# Patient Record
Sex: Male | Born: 1995 | Race: Black or African American | Hispanic: No | Marital: Single | Smoking: Current every day smoker
Health system: Southern US, Community
[De-identification: ages and names within clinical notes are randomized; demographics above are authoritative.]

## PROBLEM LIST (undated history)

## (undated) ENCOUNTER — Emergency Department (HOSPITAL_BASED_OUTPATIENT_CLINIC_OR_DEPARTMENT_OTHER): Admission: EM | Payer: Medicaid Other | Source: Home / Self Care

## (undated) ENCOUNTER — Emergency Department (HOSPITAL_COMMUNITY): Admission: EM | Payer: Medicaid Other | Source: Home / Self Care

## (undated) DIAGNOSIS — J45909 Unspecified asthma, uncomplicated: Secondary | ICD-10-CM

## (undated) HISTORY — PX: HERNIA REPAIR: SHX51

---
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
## 2018-01-22 ENCOUNTER — Encounter (HOSPITAL_COMMUNITY): Payer: Self-pay | Admitting: Emergency Medicine

## 2018-01-22 ENCOUNTER — Ambulatory Visit (HOSPITAL_COMMUNITY)
Admission: EM | Admit: 2018-01-22 | Discharge: 2018-01-22 | Disposition: A | Discharge status: Home / Self Care | Payer: Medicaid Other | Attending: Family Medicine | Admitting: Family Medicine

## 2018-01-22 DIAGNOSIS — L509 Urticaria, unspecified: Secondary | ICD-10-CM | POA: Diagnosis not present

## 2018-01-22 MED ORDER — PREDNISONE 10 MG (48) PO TBPK: ORAL_TABLET | ORAL | 0 refills | Status: DC

## 2018-01-22 NOTE — ED Triage Notes (Signed)
Pt c/o rash on bilateral arms. Denies any new creams/lotions.

## 2018-01-26 NOTE — ED Provider Notes (Signed)
[facility]   [id number] 01/22/18 Arrival Time: 1807  ASSESSMENT & PLAN:  1. Hives     Meds ordered this encounter  Medications  . predniSONE (STERAPRED UNI-PAK 48 TAB) 10 MG (48) TBPK tablet    Sig: Take as directed.    Dispense:  48 tablet    Refill:  0   OTC Benadryl if needed. Will follow up with PCP or here if worsening or failing to improve as anticipated. Reviewed expectations re: course of current medical issues. Questions answered. Outlined signs and symptoms indicating need for more acute intervention. Patient verbalized understanding. After Visit Summary given.   SUBJECTIVE:  Joshua Duncan is a 22 y.o. male who presents with a skin complaint.   Location: "all over" Onset: abrupt Duration: 10 day Pruritic? Yes Painful? No Progression: fluctuating a lot  Drainage? No  Known trigger? No  New soaps/lotions/topicals/detergents? No Environmental exposures or allergies? none Contacts with similar? No Recent travel? No  Other associated symptoms: none Therapies tried thus far: Benadryl without much help Denies fever. No specific aggravating or alleviating factors reported.  ROS: As per HPI.  OBJECTIVE: Vitals:   01/22/18 1816  BP: (!) 148/61  Pulse: 62  Resp: 18  Temp: 99.1 F (37.3 C)  SpO2: 100%    General appearance: alert; no distress Lungs: clear to auscultation bilaterally Heart: regular rate and rhythm Extremities: no edema Skin: warm and dry; smooth, slightly elevated and erythematous plaques of variable size over his extremities mainly Psychological: alert and cooperative; normal mood and affect  No Known Allergies  Past Medical History:  Diagnosis Date  . Asthma    Social History   Socioeconomic History  . Marital status: Single    Spouse name: Not on file  . Number of children: Not on file  . Years of education: Not on file  . Highest education level: Not on file  Occupational History  . Not on file  Social Needs    . Financial resource strain: Not on file  . Food insecurity:    Worry: Not on file    Inability: Not on file  . Transportation needs:    Medical: Not on file    Non-medical: Not on file  Tobacco Use  . Smoking status: Current Every Day Smoker  . Smokeless tobacco: Never Used  Substance and Sexual Activity  . Alcohol use: Yes  . Drug use: Yes    Types: Marijuana  . Sexual activity: Not on file  Lifestyle  . Physical activity:    Days per week: Not on file    Minutes per session: Not on file  . Stress: Not on file  Relationships  . Social connections:    Talks on phone: Not on file    Gets together: Not on file    Attends religious service: Not on file    Active member of club or organization: Not on file    Attends meetings of clubs or organizations: Not on file    Relationship status: Not on file  . Intimate partner violence:    Fear of current or ex partner: Not on file    Emotionally abused: Not on file    Physically abused: Not on file    Forced sexual activity: Not on file  Other Topics Concern  . Not on file  Social History Narrative  . Not on file   No family history on file. Past Surgical History:  Procedure Laterality Date  . HERNIA REPAIR  Mardella LaymanHagler, Chayse Zatarain, MD 01/26/18 [phone]

## 2018-08-30 ENCOUNTER — Ambulatory Visit (HOSPITAL_COMMUNITY)
Admission: EM | Admit: 2018-08-30 | Discharge: 2018-08-30 | Disposition: A | Discharge status: Home / Self Care | Payer: Medicaid Other | Attending: Family Medicine | Admitting: Family Medicine

## 2018-08-30 ENCOUNTER — Encounter (HOSPITAL_COMMUNITY): Payer: Self-pay | Admitting: Emergency Medicine

## 2018-08-30 DIAGNOSIS — R112 Nausea with vomiting, unspecified: Secondary | ICD-10-CM | POA: Diagnosis not present

## 2018-08-30 MED ORDER — ONDANSETRON 4 MG PO TBDP
4.0000 mg | ORAL_TABLET | Freq: Once | ORAL | Status: AC
  Administered 2018-08-30: 4 mg via ORAL

## 2018-08-30 MED ORDER — ONDANSETRON 4 MG PO TBDP
ORAL_TABLET | ORAL | Status: AC
  Filled 2018-08-30: qty 1

## 2018-08-30 MED ORDER — ONDANSETRON 4 MG PO TBDP: 4.0000 mg | ORAL_TABLET | Freq: Three times a day (TID) | ORAL | 0 refills | Status: DC | PRN

## 2018-08-30 NOTE — ED Triage Notes (Signed)
Pt sts N/V/D starting last night  

## 2018-08-30 NOTE — ED Provider Notes (Signed)
[facility]    CSN: [id number] Arrival date & time: 08/30/18  1120     History   Chief Complaint Chief Complaint  Patient presents with  . Emesis  . Diarrhea    HPI Joshua Duncan is a 22 y.o. male.   The history is provided by the patient. No language interpreter was used.  Emesis  Severity:  Moderate Timing:  Constant Number of daily episodes:  Multiple Quality:  Undigested food Progression:  Worsening Chronicity:  New Recent urination:  Normal Relieved by:  Nothing Ineffective treatments:  None tried Associated symptoms: diarrhea   Diarrhea  Associated symptoms: vomiting     Past Medical History:  Diagnosis Date  . Asthma     There are no active problems to display for this patient.   Past Surgical History:  Procedure Laterality Date  . HERNIA REPAIR         Home Medications    Prior to Admission medications   Medication Sig Start Date End Date Taking? Authorizing Provider  ondansetron (ZOFRAN ODT) 4 MG disintegrating tablet Take 1 tablet (4 mg total) by mouth every 8 (eight) hours as needed for nausea or vomiting. 08/30/18   Elson Areas, PA-C  predniSONE (STERAPRED UNI-PAK 48 TAB) 10 MG (48) TBPK tablet Take as directed. Patient not taking: Reported on 08/30/2018 01/22/18   Mardella Layman, MD    Family History History reviewed. No pertinent family history.  Social History Social History   Tobacco Use  . Smoking status: Current Every Day Smoker  . Smokeless tobacco: Never Used  Substance Use Topics  . Alcohol use: Yes  . Drug use: Yes    Types: Marijuana     Allergies   Patient has no known allergies.   Review of Systems Review of Systems  Gastrointestinal: Positive for diarrhea and vomiting.  All other systems reviewed and are negative.    Physical Exam Triage Vital Signs ED Triage Vitals [08/30/18 1137]  Enc Vitals Group     BP 129/72     Pulse Rate 72     Resp 18     Temp 98 F (36.7 C)     Temp Source  Oral     SpO2 98 %     Weight      Height      Head Circumference      Peak Flow      Pain Score 6     Pain Loc      Pain Edu?      Excl. in GC?    No data found.  Updated Vital Signs BP 129/72 (BP Location: Left Arm)   Pulse 72   Temp 98 F (36.7 C) (Oral)   Resp 18   SpO2 98%   Visual Acuity Right Eye Distance:   Left Eye Distance:   Bilateral Distance:    Right Eye Near:   Left Eye Near:    Bilateral Near:     Physical Exam  Constitutional: He appears well-developed and well-nourished.  HENT:  Head: Normocephalic and atraumatic.  Right Ear: External ear normal.  Left Ear: External ear normal.  Nose: Nose normal.  Mouth/Throat: Oropharynx is clear and moist.  Eyes: Conjunctivae are normal.  Neck: Neck supple.  Cardiovascular: Normal rate and regular rhythm.  No murmur heard. Pulmonary/Chest: Effort normal and breath sounds normal. No respiratory distress.  Abdominal: Soft. There is no tenderness.  Musculoskeletal: He exhibits no edema.  Neurological: He is alert.  Skin:  Skin is warm and dry.  Psychiatric: He has a normal mood and affect.  Nursing note and vitals reviewed.    UC Treatments / Results  Labs (all labs ordered are listed, but only abnormal results are displayed) Labs Reviewed - No data to display  EKG None  Radiology No results found.  Procedures Procedures (including critical care time)  Medications Ordered in UC Medications  ondansetron (ZOFRAN-ODT) disintegrating tablet 4 mg (4 mg Oral Given 08/30/18 1142)    Initial Impression / Assessment and Plan / UC Course  I have reviewed the triage vital signs and the nursing notes.  Pertinent labs & imaging results that were available during my care of the patient were reviewed by me and considered in my medical decision making (see chart for details).     MDM   Pt able to tolerate fluids after zofran.  Pt given Rx for zofran  Final Clinical Impressions(s) / UC Diagnoses    Final diagnoses:  Nausea and vomiting, intractability of vomiting not specified, unspecified vomiting type     Discharge Instructions     Return if any problems.    ED Prescriptions    Medication Sig Dispense Auth. Provider   ondansetron (ZOFRAN ODT) 4 MG disintegrating tablet Take 1 tablet (4 mg total) by mouth every 8 (eight) hours as needed for nausea or vomiting. 20 tablet Elson Areas, [place]     Controlled Substance Prescriptions [place] Controlled Substance Registry consulted? Not Applicable   Elson Areas, [place] 08/30/18 1238

## 2018-08-30 NOTE — Discharge Instructions (Signed)
Return if any problems.

## 2018-08-30 NOTE — ED Notes (Signed)
Pt sitting on floor vomiting after drinking ginger ale; pt instructed not to drink any more currently

## 2019-04-12 ENCOUNTER — Other Ambulatory Visit: Payer: Self-pay

## 2019-04-12 ENCOUNTER — Ambulatory Visit (HOSPITAL_COMMUNITY)
Admission: EM | Admit: 2019-04-12 | Discharge: 2019-04-12 | Disposition: A | Discharge status: Home / Self Care | Payer: Medicaid Other | Attending: Emergency Medicine | Admitting: Emergency Medicine

## 2019-04-12 ENCOUNTER — Encounter (HOSPITAL_COMMUNITY): Payer: Self-pay | Admitting: Emergency Medicine

## 2019-04-12 DIAGNOSIS — N50812 Left testicular pain: Secondary | ICD-10-CM

## 2019-04-12 MED ORDER — IBUPROFEN 600 MG PO TABS: 600.0000 mg | ORAL_TABLET | Freq: Four times a day (QID) | ORAL | 0 refills | Status: DC | PRN

## 2019-04-12 NOTE — Discharge Instructions (Addendum)
I am giving you information on epididymitis so that you know what it is, however I do not think that this is your problem today.  We will call you if your labs come back positive, requiring treatment.  Your partner will also need to be treated as well.  Follow-up with Dr. Jeffie Pollock urology if still having problems in a week, to the ER if you get worse.  Also, follow-up with a primary care physician for routine care.  See list below.  Below is a list of primary care practices who are taking new patients for you to follow-up with.  [facility] at [facility] [address] [place], [place] [address] [phone]  [place], [place] [address] [phone]  [facility] [phone] [place] [address]  [facility]: [place]  [phone]  [place] [facility]: [place]   [phone]  [facility]: [address] [place]  [phone]  [place] primary care : [address] [place] [phone]  [facility] Primary Care: [address] [place] [address] [phone]  [facility]: [place] [phone]  Dr. Blanchie Serve [employer] [place]  [phone]  Dr. Benito Mccreedy, [facility]. [place],  [address]  [phone]  Go to www.goodrx.com to look up your medications. This will give you a list of where you can find your prescriptions at the most affordable prices. Or ask the pharmacist what the cash price is, or if they have any other discount programs  available to help make your medication more affordable. This can be less expensive than what you would pay with insurance.

## 2019-04-12 NOTE — ED Provider Notes (Signed)
HPI  SUBJECTIVE:  Joshua Duncan is a 23 y.o. male who presents with left testicular discomfort described as mild, throbbing, pulsating for the past week.  States that it happens primarily at night, can last for up to an hour.  No nausea, vomiting, fevers.  No penile rash, discharge, urinary complaints.  He states that he is able to feel a "swollen cord" in the posterior aspect of his left testicle, but it is not painful.  No testicular swelling, abdominal pain, hip, pelvic, perineal pain.  No scrotal swelling.  He has been elevating his testicles with some improvement.  sx are worse with wearing boxers briefs.  It is not associated with urination, defecation, ejaculation.  He has never had symptoms like this before.  He states that the pain is not bad enough to require medications.  He is in a long-term monogamous relationship with a male who is asymptomatic.  STDs are not a concern today.  Past medical history of left hernia repair, chlamydia.  No history of gonorrhea, HIV, HSV, syphilis, trichomonas, torsion, epididymitis, prostatitis.  PMD: None.    Past Medical History:  Diagnosis Date  . Asthma     Past Surgical History:  Procedure Laterality Date  . HERNIA REPAIR      Family History  Problem Relation Age of Onset  . Healthy Mother   . Healthy Father     Social History   Tobacco Use  . Smoking status: Current Every Day Smoker  . Smokeless tobacco: Never Used  Substance Use Topics  . Alcohol use: Not Currently  . Drug use: Yes    Types: Marijuana    No current facility-administered medications for this encounter.   Current Outpatient Medications:  .  ibuprofen (ADVIL) 600 MG tablet, Take 1 tablet (600 mg total) by mouth every 6 (six) hours as needed., Disp: 30 tablet, Rfl: 0  No Known Allergies   ROS  As noted in HPI.   Physical Exam  BP 123/79 (BP Location: Left Arm)   Pulse 74   Temp 98.3 F (36.8 C) (Oral)   Resp 15   SpO2 98%   Constitutional: Well  developed, well nourished, no acute distress Eyes:  EOMI, conjunctiva normal bilaterally HENT: Normocephalic, atraumatic,mucus membranes moist Respiratory: Normal inspiratory effort Cardiovascular: Normal rate GI: nondistended soft.  No suprapubic, flank tenderness skin: No rash, skin intact Back: No CVA tenderness GU: Normal circumcised male, no penile rash or discharge.  Testicles descended bilaterally, nontender.  Palpable nontender cord posterior aspect of left testicle.  No scrotal erythema, edema.  No inguinal hernia.  Chaperone present during exam. Lymph: No inguinal lymphadenopathy  musculoskeletal: no deformities Neurologic: Alert & oriented x 3, no focal neuro deficits Psychiatric: Speech and behavior appropriate   ED Course   Medications - No data to display  No orders of the defined types were placed in this encounter.   No results found for this or any previous visit (from the past 24 hour(s)). No results found.  ED Clinical Impression  1. Testicular pain, left      ED Assessment/Plan  We will send off gonorrhea, chlamydia, trichomonas, however feel that patient is low risk, so will wait for lab results prior to treating.  Advised patient to give [place] a working phone number.  Patient has no evidence of torsion.  He may have a mild epididymitis,  does not have any real tenderness along the epididymis, however it is palpable.  No evidence of hernia, orchitis.  Will  send him home with ibuprofen 600 mg 3-4 times a day as needed for pain.  Follow-up with Dr. Annabell HowellsWrenn, urology on-call if still having symptoms in a week.  He is to go to the ER if he gets worse.  Also providing primary care referral list for ongoing care.    Meds ordered this encounter  Medications  . ibuprofen (ADVIL) 600 MG tablet    Sig: Take 1 tablet (600 mg total) by mouth every 6 (six) hours as needed.    Dispense:  30 tablet    Refill:  0    *This clinic note was created using [employer] dictation  software. Therefore, there may be occasional mistakes despite careful proofreading.   ?   Domenick GongMortenson, Jeanise Durfey, MD 04/12/19 [phone]

## 2019-04-12 NOTE — ED Notes (Signed)
Pt did not leave sample and is not in room upon entering

## 2019-04-12 NOTE — ED Triage Notes (Signed)
Groin pain for 1-2 weeks.  Pain is noticed at night and described as "uncomfortable".  Patient reports pain is intermittent.  No known injury.  Patient reports having had hernia surgery many years ago.  Pain is on the same side, the left side

## 2022-07-04 ENCOUNTER — Emergency Department (HOSPITAL_BASED_OUTPATIENT_CLINIC_OR_DEPARTMENT_OTHER)
Admission: EM | Admit: 2022-07-04 | Discharge: 2022-07-04 | Disposition: A | Discharge status: Home / Self Care | Payer: Medicaid Other | Attending: Emergency Medicine | Admitting: Emergency Medicine

## 2022-07-04 DIAGNOSIS — S39012A Strain of muscle, fascia and tendon of lower back, initial encounter: Secondary | ICD-10-CM | POA: Diagnosis not present

## 2022-07-04 DIAGNOSIS — T148XXA Other injury of unspecified body region, initial encounter: Secondary | ICD-10-CM | POA: Diagnosis not present

## 2022-07-04 DIAGNOSIS — X58XXXA Exposure to other specified factors, initial encounter: Secondary | ICD-10-CM | POA: Diagnosis not present

## 2022-07-04 DIAGNOSIS — S161XXA Strain of muscle, fascia and tendon at neck level, initial encounter: Secondary | ICD-10-CM | POA: Diagnosis not present

## 2022-07-04 DIAGNOSIS — M549 Dorsalgia, unspecified: Secondary | ICD-10-CM | POA: Diagnosis not present

## 2022-07-04 DIAGNOSIS — Y92410 Unspecified street and highway as the place of occurrence of the external cause: Secondary | ICD-10-CM | POA: Diagnosis not present

## 2022-07-04 DIAGNOSIS — S199XXA Unspecified injury of neck, initial encounter: Secondary | ICD-10-CM | POA: Diagnosis present

## 2022-07-04 MED ORDER — METHOCARBAMOL 500 MG PO TABS: 500.0000 mg | ORAL_TABLET | Freq: Three times a day (TID) | ORAL | 0 refills | Status: DC | PRN

## 2022-07-04 MED ORDER — NAPROXEN 500 MG PO TABS: 500.0000 mg | ORAL_TABLET | Freq: Two times a day (BID) | ORAL | 0 refills | Status: DC
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
## 2022-07-15 ENCOUNTER — Encounter (HOSPITAL_COMMUNITY): Payer: Self-pay | Admitting: Emergency Medicine

## 2022-07-15 ENCOUNTER — Ambulatory Visit (HOSPITAL_COMMUNITY)
Admission: EM | Admit: 2022-07-15 | Discharge: 2022-07-15 | Disposition: A | Discharge status: Home / Self Care | Payer: Medicaid Other

## 2022-07-15 DIAGNOSIS — R519 Headache, unspecified: Secondary | ICD-10-CM
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
## 2022-07-15 NOTE — Discharge Instructions (Addendum)
Please call sports medicine tomorrow morning to set up appointment for further evaluation for concussion and additional management  -On exam there are no abnormalities neurologically  -While headaches are present ensure that you are getting adequate rest and adequate fluid intake  -If dizziness reoccurs may attempt use of over-the-counter Dramamine which is typically used for motion sickness, change positions slowly waiting at least 10 seconds before movement for safety  -May take ibuprofen 600 to 800 mg every 6-8 hours and/or Tylenol 500 mg every 6 hours as needed for pain control  -Participate in low stimulation activities avoiding bright lights and loud noises when symptoms are present  -At any point if you have the worst headache of your life please go to the nearest emergency department for immediate evaluation
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
## 2022-07-16 ENCOUNTER — Ambulatory Visit (INDEPENDENT_AMBULATORY_CARE_PROVIDER_SITE_OTHER): Payer: Medicaid Other | Admitting: Sports Medicine

## 2022-07-16 ENCOUNTER — Ambulatory Visit: Payer: Medicaid Other

## 2022-07-16 VITALS — BP 130/84 | HR 80 | Ht 75.0 in | Wt 200.0 lb

## 2022-07-16 DIAGNOSIS — G44319 Acute post-traumatic headache, not intractable: Secondary | ICD-10-CM

## 2022-07-16 DIAGNOSIS — M542 Cervicalgia: Secondary | ICD-10-CM | POA: Diagnosis not present

## 2022-07-16 DIAGNOSIS — S060X0A Concussion without loss of consciousness, initial encounter: Secondary | ICD-10-CM

## 2022-07-16 MED ORDER — MELOXICAM 15 MG PO TABS: 15.0000 mg | ORAL_TABLET | Freq: Every day | ORAL | 0 refills | Status: DC
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
## 2022-07-16 NOTE — Patient Instructions (Addendum)
Good to see you  Recommendations:  -           Complete mental and physical rest for 48 hours after concussive event -           Recommend light aerobic activity while keeping symptoms less than 3/10 -           Stop mental or physical activities that cause symptoms to worsen greater than 3/10, and wait 24 hours before attempting them again -           Eliminate screen time as much as possible for first 48 hours after concussive event, then continue limited screen time (recommend less than 2 hours per day) Neck HEP  - Start meloxicam 15 mg daily x2 weeks. May use remaining meloxicam as needed once daily for pain control.  Do not to use additional NSAIDs while taking meloxicam.  May use Tylenol [id number] mg 2 to 3 times a day for breakthrough pain. 1 week follow up
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
## 2022-07-23 ENCOUNTER — Ambulatory Visit (INDEPENDENT_AMBULATORY_CARE_PROVIDER_SITE_OTHER): Payer: Medicaid Other | Admitting: Sports Medicine

## 2022-07-23 VITALS — BP 130/82 | HR 70 | Ht 75.0 in | Wt 196.0 lb

## 2022-07-23 DIAGNOSIS — M9901 Segmental and somatic dysfunction of cervical region: Secondary | ICD-10-CM

## 2022-07-23 DIAGNOSIS — S060X0A Concussion without loss of consciousness, initial encounter: Secondary | ICD-10-CM | POA: Diagnosis not present

## 2022-07-23 DIAGNOSIS — G4700 Insomnia, unspecified: Secondary | ICD-10-CM | POA: Diagnosis not present

## 2022-07-23 DIAGNOSIS — M9902 Segmental and somatic dysfunction of thoracic region: Secondary | ICD-10-CM | POA: Diagnosis not present

## 2022-07-23 DIAGNOSIS — M9908 Segmental and somatic dysfunction of rib cage: Secondary | ICD-10-CM

## 2022-07-23 DIAGNOSIS — M542 Cervicalgia: Secondary | ICD-10-CM

## 2022-07-23 DIAGNOSIS — G44319 Acute post-traumatic headache, not intractable: Secondary | ICD-10-CM | POA: Diagnosis not present

## 2022-07-23 NOTE — Patient Instructions (Addendum)
Good to see you  Continue meloxicam for 1 more week  Melatonin 5 mg nightly  2 week follow up

## 2022-08-08 ENCOUNTER — Ambulatory Visit: Payer: Medicaid Other | Admitting: Sports Medicine

## 2022-08-08 VITALS — BP 130/82 | HR 84 | Ht 75.0 in | Wt 196.0 lb

## 2022-08-08 DIAGNOSIS — M542 Cervicalgia: Secondary | ICD-10-CM

## 2022-08-08 DIAGNOSIS — G4700 Insomnia, unspecified: Secondary | ICD-10-CM | POA: Diagnosis not present

## 2022-08-08 DIAGNOSIS — G44319 Acute post-traumatic headache, not intractable: Secondary | ICD-10-CM

## 2022-08-08 DIAGNOSIS — S060X0A Concussion without loss of consciousness, initial encounter: Secondary | ICD-10-CM
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
## 2022-08-08 NOTE — Patient Instructions (Addendum)
Good to see you Okay to restart work as tolerated  Use melatonin 5 mg nightly for sleep  2 week follow up
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
## 2022-08-22 ENCOUNTER — Ambulatory Visit: Payer: Medicaid Other | Admitting: Sports Medicine

## 2022-08-22 VITALS — BP 130/80 | HR 55 | Ht 75.0 in | Wt 197.0 lb

## 2022-08-22 DIAGNOSIS — M542 Cervicalgia: Secondary | ICD-10-CM | POA: Diagnosis not present

## 2022-08-22 DIAGNOSIS — G44319 Acute post-traumatic headache, not intractable: Secondary | ICD-10-CM

## 2022-08-22 DIAGNOSIS — S060X0A Concussion without loss of consciousness, initial encounter: Secondary | ICD-10-CM | POA: Diagnosis not present

## 2022-08-22 DIAGNOSIS — G4700 Insomnia, unspecified: Secondary | ICD-10-CM

## 2022-08-22 NOTE — Patient Instructions (Signed)
Good to see you   

## 2022-10-23 ENCOUNTER — Ambulatory Visit: Payer: Medicaid Other | Admitting: Sports Medicine
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
## 2022-10-25 ENCOUNTER — Ambulatory Visit: Payer: Medicaid Other | Admitting: Sports Medicine

## 2022-10-25 ENCOUNTER — Ambulatory Visit (INDEPENDENT_AMBULATORY_CARE_PROVIDER_SITE_OTHER): Payer: Medicaid Other

## 2022-10-25 VITALS — HR 60 | Ht 75.0 in | Wt 196.0 lb

## 2022-10-25 DIAGNOSIS — M546 Pain in thoracic spine: Secondary | ICD-10-CM

## 2022-10-25 DIAGNOSIS — M545 Low back pain, unspecified: Secondary | ICD-10-CM

## 2022-10-25 DIAGNOSIS — M9902 Segmental and somatic dysfunction of thoracic region: Secondary | ICD-10-CM | POA: Diagnosis not present

## 2022-10-25 DIAGNOSIS — M9905 Segmental and somatic dysfunction of pelvic region: Secondary | ICD-10-CM

## 2022-10-25 DIAGNOSIS — M9903 Segmental and somatic dysfunction of lumbar region: Secondary | ICD-10-CM

## 2022-10-25 NOTE — Patient Instructions (Signed)
Thoracic and low back HEP  2 week follow up
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
## 2022-11-08 ENCOUNTER — Ambulatory Visit: Payer: Medicaid Other | Admitting: Sports Medicine

## 2022-11-08 VITALS — BP 122/82 | HR 86 | Ht 75.0 in | Wt 193.0 lb

## 2022-11-08 DIAGNOSIS — M546 Pain in thoracic spine: Secondary | ICD-10-CM | POA: Diagnosis not present

## 2022-11-08 DIAGNOSIS — M9902 Segmental and somatic dysfunction of thoracic region: Secondary | ICD-10-CM | POA: Diagnosis not present

## 2022-11-08 DIAGNOSIS — M545 Low back pain, unspecified: Secondary | ICD-10-CM | POA: Diagnosis not present

## 2022-11-08 DIAGNOSIS — M9903 Segmental and somatic dysfunction of lumbar region: Secondary | ICD-10-CM | POA: Diagnosis not present

## 2022-11-08 DIAGNOSIS — M9905 Segmental and somatic dysfunction of pelvic region: Secondary | ICD-10-CM
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
## 2022-12-06 ENCOUNTER — Ambulatory Visit (INDEPENDENT_AMBULATORY_CARE_PROVIDER_SITE_OTHER): Payer: Medicaid Other | Admitting: Sports Medicine

## 2022-12-06 VITALS — BP 110/80 | HR 70 | Ht 75.0 in | Wt 188.0 lb

## 2022-12-06 DIAGNOSIS — M9905 Segmental and somatic dysfunction of pelvic region: Secondary | ICD-10-CM

## 2022-12-06 DIAGNOSIS — M546 Pain in thoracic spine: Secondary | ICD-10-CM

## 2022-12-06 DIAGNOSIS — M9903 Segmental and somatic dysfunction of lumbar region: Secondary | ICD-10-CM | POA: Diagnosis not present

## 2022-12-06 DIAGNOSIS — M545 Low back pain, unspecified: Secondary | ICD-10-CM

## 2022-12-06 DIAGNOSIS — M9902 Segmental and somatic dysfunction of thoracic region: Secondary | ICD-10-CM | POA: Diagnosis not present

## 2022-12-06 NOTE — Patient Instructions (Addendum)
Good to see you PT referral  4 week follow up

## 2022-12-27 ENCOUNTER — Other Ambulatory Visit: Payer: Self-pay

## 2022-12-27 ENCOUNTER — Encounter: Payer: Self-pay | Admitting: Physical Therapy

## 2022-12-27 ENCOUNTER — Ambulatory Visit: Payer: Medicaid Other | Attending: Sports Medicine | Admitting: Physical Therapy

## 2022-12-27 DIAGNOSIS — M9905 Segmental and somatic dysfunction of pelvic region: Secondary | ICD-10-CM | POA: Insufficient documentation

## 2022-12-27 DIAGNOSIS — M9903 Segmental and somatic dysfunction of lumbar region: Secondary | ICD-10-CM | POA: Diagnosis not present

## 2022-12-27 DIAGNOSIS — M545 Low back pain, unspecified: Secondary | ICD-10-CM | POA: Diagnosis not present

## 2022-12-27 DIAGNOSIS — M6281 Muscle weakness (generalized): Secondary | ICD-10-CM

## 2022-12-27 DIAGNOSIS — M546 Pain in thoracic spine: Secondary | ICD-10-CM

## 2022-12-27 DIAGNOSIS — M5459 Other low back pain: Secondary | ICD-10-CM | POA: Diagnosis not present

## 2022-12-27 DIAGNOSIS — M9902 Segmental and somatic dysfunction of thoracic region: Secondary | ICD-10-CM | POA: Diagnosis not present
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
## 2022-12-27 NOTE — Patient Instructions (Addendum)
Access Code: [email or web address] URL: [email or web address] Date: 12/27/2022 Prepared by: Hilda Blades  Exercises - Supine Lower Trunk Rotation  - 1-2 x daily - 10 reps - 5 seconds hold - Supine Bridge  - 1 x daily - 2 sets - 5-10 reps - 3 seconds hold - Sidelying Thoracic Lumbar Rotation  - 1-2 x daily - 10 reps - 5 seconds hold - Child's Pose Stretch  - 1 x daily - 3 reps - 20 seconds hold - Seated Table Hamstring Stretch  - 1 x daily - 3 reps - 20 seconds hold - Banded Row  - 1 x daily - 2 sets - 10 reps  Trigger Point Dry Needling  What is Trigger Point Dry Needling (DN)? DN is a physical therapy technique used to treat muscle pain and dysfunction. Specifically, DN helps deactivate muscle trigger points (muscle knots).  A thin filiform needle is used to penetrate the skin and stimulate the underlying trigger point. The goal is for a local twitch response (LTR) to occur and for the trigger point to relax. No medication of any kind is injected during the procedure.   What Does Trigger Point Dry Needling Feel Like?  The procedure feels different for each individual patient. Some patients report that they do not actually feel the needle enter the skin and overall the process is not painful. Very mild bleeding may occur. However, many patients feel a deep cramping in the muscle in which the needle was inserted. This is the local twitch response.   How Will I feel after the treatment? Soreness is normal, and the onset of soreness may not occur for a few hours. Typically this soreness does not last longer than two days.  Bruising is uncommon, however; ice can be used to decrease any possible bruising.  In rare cases feeling tired or nauseous after the treatment is normal. In addition, your symptoms may get worse before they get better, this period will typically not last longer than 24 hours.   What Can I do After My Treatment? Increase your hydration by drinking more water for the next  24 hours. You may place ice or heat on the areas treated that have become sore, however, do not use heat on inflamed or bruised areas. Heat often brings more relief post needling. You can continue your regular activities, but vigorous activity is not recommended initially after the treatment for 24 hours. DN is best combined with other physical therapy such as strengthening, stretching, and other therapies.
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
## 2023-01-07 ENCOUNTER — Ambulatory Visit: Payer: Medicaid Other | Admitting: Sports Medicine

## 2023-01-08 ENCOUNTER — Ambulatory Visit: Payer: Medicaid Other | Admitting: Physical Therapy

## 2023-01-08 ENCOUNTER — Encounter: Payer: Self-pay | Admitting: Physical Therapy

## 2023-01-08 ENCOUNTER — Other Ambulatory Visit: Payer: Self-pay

## 2023-01-08 DIAGNOSIS — M546 Pain in thoracic spine: Secondary | ICD-10-CM

## 2023-01-08 DIAGNOSIS — M5459 Other low back pain: Secondary | ICD-10-CM

## 2023-01-08 DIAGNOSIS — M9902 Segmental and somatic dysfunction of thoracic region: Secondary | ICD-10-CM | POA: Diagnosis not present

## 2023-01-08 DIAGNOSIS — M9903 Segmental and somatic dysfunction of lumbar region: Secondary | ICD-10-CM | POA: Diagnosis not present

## 2023-01-08 DIAGNOSIS — M545 Low back pain, unspecified: Secondary | ICD-10-CM | POA: Diagnosis not present

## 2023-01-08 DIAGNOSIS — M6281 Muscle weakness (generalized): Secondary | ICD-10-CM

## 2023-01-08 DIAGNOSIS — M9905 Segmental and somatic dysfunction of pelvic region: Secondary | ICD-10-CM | POA: Diagnosis not present

## 2023-01-08 NOTE — Patient Instructions (Signed)
Access Code: [email or web address] URL: [email or web address] Date: 01/08/2023 Prepared by: Hilda Blades  Exercises - Long Sitting Ankle Plantar Flexion with Resistance  - 1 x daily - 2 sets - 10 reps - Long Sitting Ankle Eversion with Resistance  - 1 x daily - 2 sets - 10 reps - Long Sitting Ankle Inversion with Resistance  - 1 x daily - 2 sets - 10 reps - Sidelying Thoracic Lumbar Rotation  - 1 x daily - 10 reps - 5 seconds hold - Seated Cervical Sidebending Stretch  - 1 x daily - 3 reps - 30 seconds hold - Seated Shoulder Horizontal Abduction with Resistance - Palms Down  - 1 x daily - 2 sets - 10 reps - Seated Shoulder Diagonal Pulls with Resistance  - 1 x daily - 2 sets - 10 reps - Standing Paraspinals Mobilization with Small Ball on Wall

## 2023-01-08 NOTE — Therapy (Signed)
OUTPATIENT PHYSICAL THERAPY TREATMENT NOTE   Patient Name: Joshua Duncan MRN: [id number] DOB:1995/11/25, 27 y.o., male Today's Date: 01/08/2023  PCP: None listed REFERRING PROVIDER: Glennon Mac, DO   END OF SESSION:   PT End of Session - 01/08/23 1712     Visit Number 2    Number of Visits 7    Date for PT Re-Evaluation 02/07/23    Authorization Type [employer]    Authorization Time Period 01/01/2023 - 01/29/2023    Authorization - Visit Number 1    Authorization - Number of Visits 4    PT Start Time [id number]    PT Stop Time 1700    PT Time Calculation (min) 43 min    Activity Tolerance Patient tolerated treatment well    Behavior During Therapy WFL for tasks assessed/performed             Past Medical History:  Diagnosis Date   Asthma    Past Surgical History:  Procedure Laterality Date   HERNIA REPAIR     There are no problems to display for this patient.   REFERRING DIAG: Acute bilateral thoracic back pain, Acute bilateral low back pain without sciatica   THERAPY DIAG:  Other low back pain  Pain in thoracic spine  Muscle weakness (generalized)  Rationale for Evaluation and Treatment Rehabilitation  PERTINENT HISTORY: None   PRECAUTIONS: None    SUBJECTIVE:                                                                                                                                                                                     SUBJECTIVE STATEMENT:  Patient reports his back is feeling about the same. He took a road trip this past weekend and it was killing him. He was in the car about 8-10 hours all together. Upper back is bothering him still too but it is not as bad as the lower back. Then today he may have slept wrong but his right side is bothering him.   PAIN:  Are you having pain? Yes:  NPRS scale: 6/10 Pain location: Lower back Pain description: Tightness Aggravating factors: Early in the morning, cold weather, lifting, sitting,  bending Relieving factors: Movement, stretching   OBJECTIVE: (objective measures completed at initial evaluation unless otherwise dated) PATIENT SURVEYS:  Modified Oswestry 16% disability   MUSCLE LENGTH: Bilateral hamstring limitation   POSTURE:             Rounded shoulders, decreased lumbar lordosis   PALPATION: Tender to palpation bilateral lumbar and thoracic paraspinals   LUMBAR ROM:    AROM eval  Flexion WFL  Extension WFL  Right lateral  flexion WFL  Left lateral flexion WFL  Right rotation WFL  Left rotation WFL   (Blank rows = not tested)   Flexion, L > R side bend, and L rotation reports tightness   LOWER EXTREMITY ROM:                          LE ROM grossly WFL and non-painful   LOWER EXTREMITY MMT:     MMT Right eval Left eval  Hip flexion 4 4  Hip extension 4 4  Hip abduction 4 4  Hip adduction      Hip internal rotation      Hip external rotation      Knee flexion 5 5  Knee extension 5 5  Ankle dorsiflexion      Ankle plantarflexion      Ankle inversion      Ankle eversion       (Blank rows = not tested)   UPPER EXTREMITY MMT:   MMT Right eval Left eval  Shoulder flexion      Shoulder extension      Shoulder abduction      Shoulder adduction      Shoulder extension      Shoulder internal rotation      Shoulder external rotation      Middle trapezius 4- 4-  Lower trapezius 4- 4-  Elbow flexion      Elbow extension      Wrist flexion      Wrist extension      Wrist ulnar deviation      Wrist radial deviation      Wrist pronation      Wrist supination      Grip strength       (Blank rows = not tested)   LUMBAR SPECIAL TESTS:  Lumbar radicular testing negative   FUNCTIONAL TESTS:  DLLT: loss of lumbar control and report of pain at 45 deg Lifting: increased lumbar flexion with lifting technique   GAIT: Assistive device utilized: None Level of assistance: Complete Independence Comments: WFL     TODAY'S TREATMENT:     [facility] Adult PT Treatment:                                                DATE: 01/08/2023 Therapeutic Exercise: Recumbent bike L2 x 5 min while taking subjective Cobra to down dog x 5 Supine pelvic tilts x 10 Belt assisted and resisted pelvic tilt with belt x 10 Seated pelvic tilt on physioball x 10 Quadruped cat cow / /pelvic tilts x 10  SMFR using peanut tennis ball to lumbar paraspinals Manual: Lumbar PA mobs at various levels Prone press up with PA mobs Passive hamstring stretching 2 x 30 sec   [facility] Adult PT Treatment:                                                DATE: 12/27/2022 Therapeutic Exercise: LTR x 10 Bridge x 10 Sidelying thoracic rotation x 10 each Child's pose stretch 2 x 20 sec Longsitting hamstring stretch 2 x 20 sec each Row with blue  x 10   PATIENT EDUCATION:  Education details: HEP update Person  educated: Patient Education method: Explanation, Demonstration, Tactile cues, Verbal cues, and Handouts Education comprehension: verbalized understanding, returned demonstration, verbal cues required, tactile cues required, and needs further education   HOME EXERCISE PROGRAM: Access Code: [email or web address]      ASSESSMENT: CLINICAL IMPRESSION: Patient tolerated therapy well with no adverse effects. He continues to report low back stiffness especially following recent road trip. Therapy focused on improve lumbar mobility and lumbopelvic muscular control. He continues to demonstrate limitations with volitional lumbar and pelvic tilting especially in a quadruped position with the inability to performing anterior pelvic tilt to increased lumbar lordosis. He also demonstrates muscular tension of lumbar paraspinals an elected not to perform dry needling this visit. Patient would benefit from continued skilled PT to progress his mobility and strength in order to reduce pain and maximize functional ability.     OBJECTIVE IMPAIRMENTS: decreased activity tolerance, decreased  strength, impaired flexibility, improper body mechanics, postural dysfunction, and pain.    ACTIVITY LIMITATIONS: lifting, bending, and sitting   PARTICIPATION LIMITATIONS: cleaning and driving   PERSONAL FACTORS: Past/current experiences and Time since onset of injury/illness/exacerbation are also affecting patient's functional outcome.      GOALS: Goals reviewed with patient? Yes   SHORT TERM GOALS: Target date: 01/17/2023   Patient will be I with initial HEP in order to progress with therapy. Baseline: HEP provided at eval Goal status: INITIAL   2.  Patient will report pain/tightness </= 1/10 with activity and sitting in order to reduce functional limitations Baseline: 3/10 Goal status: INITIAL   3.  Patient will demonstrate proper lifting mechanics to reduce low back pain and tightness with lifting and performing household tasks Baseline: increased lumbar flexion with lifting Goal status: INITIAL   LONG TERM GOALS: Target date: 02/07/2023   Patient will be I with final HEP to maintain progress from PT. Baseline: HEP provided at eval Goal status: INITIAL   2.  Patient will report </= 5% disability on modified ODI in order to indicate improved functional ability with lifting and performing household tasks Baseline: 16% Goal status: INITIAL   3.  Patient will report no pain or tightness with spinal AROM in order to improve ability to perform household tasks and activities that require bending Baseline: patient reports pain and tightness with lumbar flexion, left > right side bend, and left rotation Goal status: INITIAL   4.  Patient will demonstrate DLLT </= 30 deg and periscapular muscle strength >/= 4/5 MMT in order to indicate improve core and posture control in order to reduce pain with lifting and improve sitting tolerance Baseline:  Goal status: INITIAL     PLAN: PT FREQUENCY: 1-2x/week   PT DURATION: 6 weeks   PLANNED INTERVENTIONS: Therapeutic exercises,  Therapeutic activity, Neuromuscular re-education, Balance training, Gait training, Patient/Family education, Self Care, Joint mobilization, Joint manipulation, Dry Needling, Spinal manipulation, Spinal mobilization, Taping, Manual therapy, and Re-evaluation.   PLAN FOR NEXT SESSION: Review HEP and progress PRN, manual / dry needling for lumbar paraspinal and periscapular muscles, progress core and postural strength and control   Hilda Blades, PT, DPT, LAT, ATC 01/08/23  5:14 PM Phone: [phone] Fax: [phone]
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
## 2023-01-15 ENCOUNTER — Ambulatory Visit: Payer: Medicaid Other | Admitting: Sports Medicine

## 2023-01-15 VITALS — BP 130/80 | HR 87 | Ht 75.0 in | Wt 185.0 lb

## 2023-01-15 DIAGNOSIS — M546 Pain in thoracic spine: Secondary | ICD-10-CM

## 2023-01-15 DIAGNOSIS — M9905 Segmental and somatic dysfunction of pelvic region: Secondary | ICD-10-CM | POA: Diagnosis not present

## 2023-01-15 DIAGNOSIS — M9903 Segmental and somatic dysfunction of lumbar region: Secondary | ICD-10-CM

## 2023-01-15 DIAGNOSIS — G8929 Other chronic pain: Secondary | ICD-10-CM | POA: Diagnosis not present

## 2023-01-15 DIAGNOSIS — M9901 Segmental and somatic dysfunction of cervical region: Secondary | ICD-10-CM | POA: Diagnosis not present

## 2023-01-15 DIAGNOSIS — M542 Cervicalgia: Secondary | ICD-10-CM

## 2023-01-15 DIAGNOSIS — M9902 Segmental and somatic dysfunction of thoracic region: Secondary | ICD-10-CM

## 2023-01-15 DIAGNOSIS — M545 Low back pain, unspecified: Secondary | ICD-10-CM

## 2023-01-15 DIAGNOSIS — M9908 Segmental and somatic dysfunction of rib cage: Secondary | ICD-10-CM | POA: Diagnosis not present

## 2023-01-15 NOTE — Patient Instructions (Signed)
Good to see you   

## 2023-01-15 NOTE — Therapy (Signed)
OUTPATIENT PHYSICAL THERAPY TREATMENT NOTE   Patient Name: Joshua Duncan MRN: [id number] DOB:Mar 05, 1996, 27 y.o., male Today's Date: 01/16/2023  PCP: None listed REFERRING PROVIDER: Glennon Mac, DO   END OF SESSION:   PT End of Session - 01/16/23 0952     Visit Number 3    Number of Visits 7    Date for PT Re-Evaluation 02/07/23    Authorization Type [employer]    Authorization Time Period 01/01/2023 - 01/29/2023    Authorization - Visit Number 2    Authorization - Number of Visits 4    PT Start Time 0945    PT Stop Time 1015    PT Time Calculation (min) 30 min    Activity Tolerance Patient tolerated treatment well    Behavior During Therapy WFL for tasks assessed/performed              Past Medical History:  Diagnosis Date   Asthma    Past Surgical History:  Procedure Laterality Date   HERNIA REPAIR     There are no problems to display for this patient.   REFERRING DIAG: Acute bilateral thoracic back pain, Acute bilateral low back pain without sciatica   THERAPY DIAG:  Other low back pain  Pain in thoracic spine  Muscle weakness (generalized)  Rationale for Evaluation and Treatment Rehabilitation  PERTINENT HISTORY: None   PRECAUTIONS: None    SUBJECTIVE:                                                                                                                                                                                     SUBJECTIVE STATEMENT:  Patient reports he is working and is sore from all the bending, but he thinks it is because he hasn't been working consistently. States the stiffness still feels about the same, doesn't feel worse.   PAIN:  Are you having pain? Yes:  NPRS scale: 4-5/10 (lower), 3-4/10 (upper) Pain location: Back Pain description: Tightness Aggravating factors: Early in the morning, cold weather, lifting, sitting, bending Relieving factors: Movement, stretching   OBJECTIVE: (objective measures completed  at initial evaluation unless otherwise dated) PATIENT SURVEYS:  Modified Oswestry 16% disability   MUSCLE LENGTH: Bilateral hamstring limitation   POSTURE:             Rounded shoulders, decreased lumbar lordosis   PALPATION: Tender to palpation bilateral lumbar and thoracic paraspinals   LUMBAR ROM:    AROM eval  Flexion WFL  Extension WFL  Right lateral flexion WFL  Left lateral flexion WFL  Right rotation WFL  Left rotation WFL   (Blank rows = not tested)   Flexion, L >  R side bend, and L rotation reports tightness   LOWER EXTREMITY ROM:                          LE ROM grossly WFL and non-painful   LOWER EXTREMITY MMT:     MMT Right eval Left eval  Hip flexion 4 4  Hip extension 4 4  Hip abduction 4 4  Hip adduction      Hip internal rotation      Hip external rotation      Knee flexion 5 5  Knee extension 5 5  Ankle dorsiflexion      Ankle plantarflexion      Ankle inversion      Ankle eversion       (Blank rows = not tested)   UPPER EXTREMITY MMT:   MMT Right eval Left eval Rt / Lt 01/16/23  Shoulder flexion       Shoulder extension       Shoulder abduction       Shoulder adduction       Shoulder extension       Shoulder internal rotation       Shoulder external rotation       Middle trapezius 4- 4- 4- / 4-  Lower trapezius 4- 4- 4- / 4-  Elbow flexion       Elbow extension       Wrist flexion       Wrist extension       Wrist ulnar deviation       Wrist radial deviation       Wrist pronation       Wrist supination       Grip strength        (Blank rows = not tested)   LUMBAR SPECIAL TESTS:  Lumbar radicular testing negative   FUNCTIONAL TESTS:  DLLT: loss of lumbar control and report of pain at 45 deg Lifting: increased lumbar flexion with lifting technique   GAIT: Assistive device utilized: None Level of assistance: Complete Independence Comments: WFL     TODAY'S TREATMENT:    [facility] Adult PT Treatment:                                                 DATE: 01/16/2023 Therapeutic Exercise: UBE L1 x 3 min bwd while taking subjective Sidelying thoracic rotation x 10 each Bridge 2 x 10 90-90 alternating leg extensions 2 x 10 Trialed modified side plank but reports scapular pain Prone I, T, Y x 10 each Bird dog 2 x 10   [facility] Adult PT Treatment:                                                DATE: 01/08/2023 Therapeutic Exercise: Recumbent bike L2 x 5 min while taking subjective Cobra to down dog x 5 Supine pelvic tilts x 10 Belt assisted and resisted pelvic tilt with belt x 10 Seated pelvic tilt on physioball x 10 Quadruped cat cow / /pelvic tilts x 10  SMFR using peanut tennis ball to lumbar paraspinals Manual: Lumbar PA mobs at various levels Prone press up with PA mobs Passive hamstring stretching 2 x  30 sec  [facility] Adult PT Treatment:                                                DATE: 12/27/2022 Therapeutic Exercise: LTR x 10 Bridge x 10 Sidelying thoracic rotation x 10 each Child's pose stretch 2 x 20 sec Longsitting hamstring stretch 2 x 20 sec each Row with blue  x 10   PATIENT EDUCATION:  Education details: HEP update Person educated: Patient Education method: Explanation, Demonstration, Tactile cues, Verbal cues, and Handouts Education comprehension: verbalized understanding, returned demonstration, verbal cues required, tactile cues required, and needs further education   HOME EXERCISE PROGRAM: Access Code: [id number]      ASSESSMENT: CLINICAL IMPRESSION: Patient tolerated therapy well with no adverse effects. 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 Therapy focused primarily on core stabilization and postural control with god tolerance. He did report muscular fatigue and feeling like he had worked out following the session. Updated HEP to progress strengthening and control exercises. Patient would benefit from continued skilled PT to progress  his mobility and strength in order to reduce pain and maximize functional ability.     OBJECTIVE IMPAIRMENTS: decreased activity tolerance, decreased strength, impaired flexibility, improper body mechanics, postural dysfunction, and pain.    ACTIVITY LIMITATIONS: lifting, bending, and sitting   PARTICIPATION LIMITATIONS: cleaning and driving   PERSONAL FACTORS: Past/current experiences and Time since onset of injury/illness/exacerbation are also affecting patient's functional outcome.      GOALS: Goals reviewed with patient? Yes   SHORT TERM GOALS: Target date: 01/17/2023   Patient will be I with initial HEP in order to progress with therapy. Baseline: HEP provided at eval Goal status: INITIAL   2.  Patient will report pain/tightness </= 1/10 with activity and sitting in order to reduce functional limitations Baseline: 3/10 Goal status: INITIAL   3.  Patient will demonstrate proper lifting mechanics to reduce low back pain and tightness with lifting and performing household tasks Baseline: increased lumbar flexion with lifting Goal status: INITIAL   LONG TERM GOALS: Target date: 02/07/2023   Patient will be I with final HEP to maintain progress from PT. Baseline: HEP provided at eval Goal status: INITIAL   2.  Patient will report </= 5% disability on modified ODI in order to indicate improved functional ability with lifting and performing household tasks Baseline: 16% Goal status: INITIAL   3.  Patient will report no pain or tightness with spinal AROM in order to improve ability to perform household tasks and activities that require bending Baseline: patient reports pain and tightness with lumbar flexion, left > right side bend, and left rotation Goal status: INITIAL   4.  Patient will demonstrate DLLT </= 30 deg and periscapular muscle strength >/= 4/5 MMT in order to indicate improve core and posture control in order to reduce pain with lifting and improve sitting  tolerance Baseline:  Goal status: INITIAL     PLAN: PT FREQUENCY: 1-2x/week   PT DURATION: 6 weeks   PLANNED INTERVENTIONS: Therapeutic exercises, Therapeutic activity, Neuromuscular re-education, Balance training, Gait training, Patient/Family education, Self Care, Joint mobilization, Joint manipulation, Dry Needling, Spinal manipulation, Spinal mobilization, Taping, Manual therapy, and Re-evaluation.   PLAN FOR NEXT SESSION: Review HEP and progress PRN, manual / dry needling for lumbar paraspinal and periscapular muscles,  progress core and postural strength and control   Hilda Blades, PT, DPT, LAT, ATC 01/16/23  10:39 AM Phone: [phone] Fax: [phone]

## 2023-01-16 ENCOUNTER — Ambulatory Visit: Payer: Medicaid Other | Admitting: Physical Therapy

## 2023-01-16 ENCOUNTER — Encounter: Payer: Self-pay | Admitting: Physical Therapy

## 2023-01-16 ENCOUNTER — Other Ambulatory Visit: Payer: Self-pay

## 2023-01-16 DIAGNOSIS — M546 Pain in thoracic spine: Secondary | ICD-10-CM

## 2023-01-16 DIAGNOSIS — M9905 Segmental and somatic dysfunction of pelvic region: Secondary | ICD-10-CM | POA: Diagnosis not present

## 2023-01-16 DIAGNOSIS — M9903 Segmental and somatic dysfunction of lumbar region: Secondary | ICD-10-CM | POA: Diagnosis not present

## 2023-01-16 DIAGNOSIS — M6281 Muscle weakness (generalized): Secondary | ICD-10-CM | POA: Diagnosis not present

## 2023-01-16 DIAGNOSIS — M5459 Other low back pain: Secondary | ICD-10-CM

## 2023-01-16 DIAGNOSIS — M545 Low back pain, unspecified: Secondary | ICD-10-CM | POA: Diagnosis not present

## 2023-01-16 DIAGNOSIS — M9902 Segmental and somatic dysfunction of thoracic region: Secondary | ICD-10-CM | POA: Diagnosis not present

## 2023-01-16 NOTE — Patient Instructions (Signed)
Access Code: [email or web address] URL: [email or web address] Date: 01/16/2023 Prepared by: Hilda Blades  Exercises - Supine Lower Trunk Rotation  - 1-2 x daily - 10 reps - 5 seconds hold - Sidelying Thoracic Lumbar Rotation  - 1-2 x daily - 10 reps - 5 seconds hold - Child's Pose Stretch  - 1 x daily - 3 reps - 20 seconds hold - Seated Table Hamstring Stretch  - 1 x daily - 3 reps - 20 seconds hold - Supine Bridge  - 1 x daily - 2 sets - 10 reps - 3 seconds hold - Supine 90/90 with Leg Extensions  - 1 x daily - 2 sets - 10 reps - Prone Scapular Slide with Shoulder Extension  - 1 x daily - 2 sets - 10 reps - Prone T  - 1 x daily - 2 sets - 10 reps - Prone Y  - 1 x daily - 2 sets - 10 reps - Banded Row  - 1 x daily - 2 sets - 10 reps

## 2023-01-23 ENCOUNTER — Other Ambulatory Visit: Payer: Self-pay

## 2023-01-23 ENCOUNTER — Encounter: Payer: Self-pay | Admitting: Physical Therapy

## 2023-01-23 ENCOUNTER — Ambulatory Visit: Payer: Medicaid Other | Attending: Sports Medicine | Admitting: Physical Therapy

## 2023-01-23 DIAGNOSIS — M5459 Other low back pain: Secondary | ICD-10-CM | POA: Insufficient documentation

## 2023-01-23 DIAGNOSIS — M546 Pain in thoracic spine: Secondary | ICD-10-CM | POA: Diagnosis not present

## 2023-01-23 DIAGNOSIS — M6281 Muscle weakness (generalized): Secondary | ICD-10-CM | POA: Insufficient documentation

## 2023-01-23 NOTE — Therapy (Signed)
OUTPATIENT PHYSICAL THERAPY TREATMENT NOTE   Patient Name: Joshua Duncan MRN: [id number] DOB:1996-08-06, 27 y.o., male Today's Date: 01/23/2023  PCP: None listed REFERRING PROVIDER: Glennon Mac, DO   END OF SESSION:   PT End of Session - 01/23/23 0806     Visit Number 4    Number of Visits 7    Date for PT Re-Evaluation 02/07/23    Authorization Type [employer]    Authorization Time Period 01/01/2023 - 01/29/2023    Authorization - Visit Number 3    Authorization - Number of Visits 4    PT Start Time 0802    PT Stop Time 0845    PT Time Calculation (min) 43 min    Activity Tolerance Patient tolerated treatment well    Behavior During Therapy WFL for tasks assessed/performed               Past Medical History:  Diagnosis Date   Asthma    Past Surgical History:  Procedure Laterality Date   HERNIA REPAIR     There are no problems to display for this patient.   REFERRING DIAG: Acute bilateral thoracic back pain, Acute bilateral low back pain without sciatica   THERAPY DIAG:  Other low back pain  Pain in thoracic spine  Muscle weakness (generalized)  Rationale for Evaluation and Treatment Rehabilitation  PERTINENT HISTORY: None   PRECAUTIONS: None    SUBJECTIVE:                                                                                                                                                                                     SUBJECTIVE STATEMENT:  Patient reports he is is doing alright. He has been busy with work. He is feeling about the same but he has been working that requires a lot of liftng and working out so is doing more.   PAIN:  Are you having pain? Yes:  NPRS scale: 5/10 (lower), 4/10 (upper) Pain location: Back Pain description: Sore, tightness Aggravating factors: Early in the morning, cold weather, lifting, sitting, bending Relieving factors: Movement, stretching   OBJECTIVE: (objective measures completed at  initial evaluation unless otherwise dated) PATIENT SURVEYS:  Modified Oswestry 16% disability   01/23/2023: 14%  MUSCLE LENGTH: Bilateral hamstring limitation   POSTURE:             Rounded shoulders, decreased lumbar lordosis   PALPATION: Tender to palpation bilateral lumbar and thoracic paraspinals   LUMBAR ROM:    AROM eval  Flexion WFL  Extension WFL  Right lateral flexion WFL  Left lateral flexion WFL  Right rotation WFL  Left rotation WFL   (Blank  rows = not tested)   Flexion, L > R side bend, and L rotation reports tightness  01/23/2023: no increased pain reported with active motion   LOWER EXTREMITY ROM:                          LE ROM grossly WFL and non-painful   LOWER EXTREMITY MMT:     MMT Right eval Left eval  Hip flexion 4 4  Hip extension 4 4  Hip abduction 4 4  Hip adduction      Hip internal rotation      Hip external rotation      Knee flexion 5 5  Knee extension 5 5  Ankle dorsiflexion      Ankle plantarflexion      Ankle inversion      Ankle eversion       (Blank rows = not tested)   UPPER EXTREMITY MMT:   MMT Right eval Left eval Rt / Lt 01/16/23 Rt / Lt 01/23/23  Shoulder flexion        Shoulder extension        Shoulder abduction        Shoulder adduction        Shoulder extension        Shoulder internal rotation        Shoulder external rotation        Middle trapezius 4- 4- 4- / 4- 4 / 4  Lower trapezius 4- 4- 4- / 4- 4- / 4-  Elbow flexion        Elbow extension        Wrist flexion        Wrist extension        Wrist ulnar deviation        Wrist radial deviation        Wrist pronation        Wrist supination        Grip strength         (Blank rows = not tested)   LUMBAR SPECIAL TESTS:  Lumbar radicular testing negative   FUNCTIONAL TESTS:  DLLT: loss of lumbar control and report of pain at 45 deg  01/23/2023: loss of lumbar control at 35 deg, no pain reported Lifting: increased lumbar flexion with lifting  technique  01/23/2023: patient continues to demonstrate increased lumbar flexion with lifting       TODAY'S TREATMENT:    [facility] Adult PT Treatment:                                                DATE: 01/23/2023 Therapeutic Exercise: Recumbent bike L3 x 4 min while taking subjective Supine hamstring stretch with strap Figure-4 bridge 2 x 10 each 90-90 alternating leg extensions 2 x 10 Modified side plank on knees 4 x 10 sec each Bird dog 2 x 10 Prone Y 2 x 10 each Hip hinge using dowel x x10 Hip hinge sliding 15# bar down thighs to knees Deadlift with 45# 2 x 10 - max cueing for hip hinge technique and maintaining neutral lumbar spine   [facility] Adult PT Treatment:  DATE: 01/16/2023 Therapeutic Exercise: UBE L1 x 3 min bwd while taking subjective Sidelying thoracic rotation x 10 each Bridge 2 x 10 90-90 alternating leg extensions 2 x 10 Trialed modified side plank but reports scapular pain Prone I, T, Y x 10 each Bird dog 2 x 10  [facility] Adult PT Treatment:                                                DATE: 01/08/2023 Therapeutic Exercise: Recumbent bike L2 x 5 min while taking subjective Cobra to down dog x 5 Supine pelvic tilts x 10 Belt assisted and resisted pelvic tilt with belt x 10 Seated pelvic tilt on physioball x 10 Quadruped cat cow / /pelvic tilts x 10  SMFR using peanut tennis ball to lumbar paraspinals Manual: Lumbar PA mobs at various levels Prone press up with PA mobs Passive hamstring stretching 2 x 30 sec   PATIENT EDUCATION:  Education details: HEP Person educated: Patient Education method: Consulting civil engineer, Media planner, Corporate treasurer cues, Verbal cues Education comprehension: verbalized understanding, returned demonstration, verbal cues required, tactile cues required, and needs further education   HOME EXERCISE PROGRAM: Access Code: [email or web address]      ASSESSMENT: CLINICAL IMPRESSION: Patient tolerated therapy well with no  adverse effects. He is making progress in therapy, demonstrating improved strength, mobility, and report of functional ability on Modified Oswestry. 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 Therapy continues to focus primarily on progressing his core and postural strengthening, and lifting ability with good tolerance. He require max cueing for proper lifting technique to use hip hinge and avoid excessive lumbar flexion. No increase in pain reported with therapy but he did not fatigue and soreness post session. Progressed his HEP this visit to continue with core strengthening for home. Patient would benefit from continued skilled PT to progress his mobility and strength in order to reduce pain and maximize functional ability.     OBJECTIVE IMPAIRMENTS: decreased activity tolerance, decreased strength, impaired flexibility, improper body mechanics, postural dysfunction, and pain.    ACTIVITY LIMITATIONS: lifting, bending, and sitting   PARTICIPATION LIMITATIONS: cleaning and driving   PERSONAL FACTORS: Past/current experiences and Time since onset of injury/illness/exacerbation are also affecting patient's functional outcome.      GOALS: Goals reviewed with patient? Yes   SHORT TERM GOALS: Target date: 01/17/2023   Patient will be I with initial HEP in order to progress with therapy. Baseline: HEP provided at eval 01/23/2023: independent with initial HEP Goal status: MET   2.  Patient will report pain/tightness </= 1/10 with activity and sitting in order to reduce functional limitations Baseline: 3/10 01/23/2023: 4-5/10 pain Goal status: IN PROGRESS   3.  Patient will demonstrate proper lifting mechanics to reduce low back pain and tightness with lifting and performing household tasks Baseline: increased lumbar flexion with lifting 01/23/2023: patient continues to demonstrate increased lumbar  flexion with lifting Goal status: IN PROGRESS   LONG TERM GOALS: Target date: 02/07/2023   Patient will be I with final HEP to maintain progress from PT. Baseline: HEP provided at eval 01/23/2023: progressing HEP Goal status: IN PROGRESS   2.  Patient will report </= 5% disability on modified ODI in order to indicate improved functional ability with lifting and  performing household tasks Baseline: 16% 01/23/2023: 14% Goal status: PARTIALLY MET   3.  Patient will report no pain or tightness with spinal AROM in order to improve ability to perform household tasks and activities that require bending Baseline: patient reports pain and tightness with lumbar flexion, left > right side bend, and left rotation 01/23/2023: lumbar motion WFL and no increased pain reported with movement Goal status: MET   4.  Patient will demonstrate DLLT </= 30 deg and periscapular muscle strength >/= 4/5 MMT in order to indicate improve core and posture control in order to reduce pain with lifting and improve sitting tolerance Baseline: DLLT 45 deg and periscapular muscle strength 4-/5 MMT 01/23/2023: DLLT 35 deg and lower trap strength 4-/5 MMT Goal status: PARTIALLY MET     PLAN: PT FREQUENCY: 1-2x/week   PT DURATION: 6 weeks   PLANNED INTERVENTIONS: Therapeutic exercises, Therapeutic activity, Neuromuscular re-education, Balance training, Gait training, Patient/Family education, Self Care, Joint mobilization, Joint manipulation, Dry Needling, Spinal manipulation, Spinal mobilization, Taping, Manual therapy, and Re-evaluation.   PLAN FOR NEXT SESSION: Review HEP and progress PRN, manual / dry needling for lumbar paraspinal and periscapular muscles, progress core and postural strength and control   Hilda Blades, PT, DPT, LAT, ATC 01/23/23  9:51 AM Phone: [phone] Fax: [phone]   Check all possible CPT codes: [id number] - PT Re-evaluation, 97110- Therapeutic Exercise, [phone]- Neuro Re-education, [phone] -  Gait Training, [phone] - Manual Therapy, 97530 - Therapeutic Activities, and 97535 - Self Care                           Check all conditions that are expected to impact treatment: None of these apply

## 2023-01-23 NOTE — Patient Instructions (Signed)
Access Code: [email or web address] URL: [email or web address] Date: 01/23/2023 Prepared by: Hilda Blades  Exercises - Supine Lower Trunk Rotation  - 1-2 x daily - 10 reps - 5 seconds hold - Sidelying Thoracic Lumbar Rotation  - 1-2 x daily - 10 reps - 5 seconds hold - Child's Pose Stretch  - 1 x daily - 3 reps - 20 seconds hold - Seated Table Hamstring Stretch  - 1 x daily - 3 reps - 20 seconds hold - Supine Bridge  - 1 x daily - 2 sets - 10 reps - 3 seconds hold - Supine 90/90 with Leg Extensions  - 1 x daily - 2 sets - 10 reps - Prone Scapular Slide with Shoulder Extension  - 1 x daily - 2 sets - 10 reps - Prone T  - 1 x daily - 2 sets - 10 reps - Prone Y  - 1 x daily - 2 sets - 10 reps - Side Plank on Knees  - 1 x daily - 5 reps - 10-20 seconds hold - Banded Row  - 1 x daily - 2 sets - 10 reps
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
## 2023-01-29 ENCOUNTER — Ambulatory Visit (INDEPENDENT_AMBULATORY_CARE_PROVIDER_SITE_OTHER): Payer: Medicaid Other | Admitting: Sports Medicine

## 2023-01-29 VITALS — BP 130/78 | HR 85 | Ht 75.0 in | Wt 183.0 lb

## 2023-01-29 DIAGNOSIS — G8929 Other chronic pain: Secondary | ICD-10-CM

## 2023-01-29 DIAGNOSIS — M9905 Segmental and somatic dysfunction of pelvic region: Secondary | ICD-10-CM | POA: Diagnosis not present

## 2023-01-29 DIAGNOSIS — M542 Cervicalgia: Secondary | ICD-10-CM

## 2023-01-29 DIAGNOSIS — M9901 Segmental and somatic dysfunction of cervical region: Secondary | ICD-10-CM

## 2023-01-29 DIAGNOSIS — M546 Pain in thoracic spine: Secondary | ICD-10-CM

## 2023-01-29 DIAGNOSIS — M9903 Segmental and somatic dysfunction of lumbar region: Secondary | ICD-10-CM | POA: Diagnosis not present

## 2023-01-29 DIAGNOSIS — M9902 Segmental and somatic dysfunction of thoracic region: Secondary | ICD-10-CM | POA: Diagnosis not present

## 2023-01-29 DIAGNOSIS — M9908 Segmental and somatic dysfunction of rib cage: Secondary | ICD-10-CM | POA: Diagnosis not present

## 2023-01-29 DIAGNOSIS — M545 Low back pain, unspecified: Secondary | ICD-10-CM | POA: Diagnosis not present

## 2023-01-29 NOTE — Patient Instructions (Signed)
Good to see you   

## 2023-01-30 ENCOUNTER — Encounter: Payer: Self-pay | Admitting: Physical Therapy

## 2023-01-30 ENCOUNTER — Ambulatory Visit: Payer: Medicaid Other | Admitting: Physical Therapy

## 2023-01-30 ENCOUNTER — Other Ambulatory Visit: Payer: Self-pay

## 2023-01-30 DIAGNOSIS — M6281 Muscle weakness (generalized): Secondary | ICD-10-CM

## 2023-01-30 DIAGNOSIS — M5459 Other low back pain: Secondary | ICD-10-CM

## 2023-01-30 DIAGNOSIS — M546 Pain in thoracic spine: Secondary | ICD-10-CM
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
## 2023-02-05 NOTE — Therapy (Addendum)
OUTPATIENT PHYSICAL THERAPY TREATMENT NOTE  DISCHARGE   Patient Name: Joshua Duncan MRN: [id number] DOB:[birth year], 27 y.o., male Today's Date: 02/06/2023  PCP: None listed REFERRING PROVIDER: Richardean Sale, DO   END OF SESSION:   PT End of Session - 02/06/23 0802     Visit Number 6    Number of Visits 10    Date for PT Re-Evaluation 03/06/23    Authorization Type [employer]    Authorization Time Period 01/30/23 - 03/30/23    Authorization - Visit Number 2    Authorization - Number of Visits 5    PT Start Time 0800    PT Stop Time 0845    PT Time Calculation (min) 45 min    Activity Tolerance Patient tolerated treatment well    Behavior During Therapy WFL for tasks assessed/performed                 Past Medical History:  Diagnosis Date   Asthma    Past Surgical History:  Procedure Laterality Date   HERNIA REPAIR     There are no problems to display for this patient.   REFERRING DIAG: Acute bilateral thoracic back pain, Acute bilateral low back pain without sciatica   THERAPY DIAG:  Other low back pain  Pain in thoracic spine  Muscle weakness (generalized)  Rationale for Evaluation and Treatment Rehabilitation  PERTINENT HISTORY: None   PRECAUTIONS: None    SUBJECTIVE:                                                                                                                                                                                     SUBJECTIVE STATEMENT:  Patient reports he has been doing a lot of bending and lifting at work so the lower back has been bothering him more. States he doesn't remember the upper back bothering him this week.  PAIN:  Are you having pain? Yes:  NPRS scale: 4/10 (lower), 0/10 (upper) Pain location: Back Pain description: Sore, tightness Aggravating factors: Early in the morning, cold weather, lifting, sitting, bending Relieving factors: Movement, stretching   OBJECTIVE: (objective measures  completed at initial evaluation unless otherwise dated) PATIENT SURVEYS:  Modified Oswestry 16% disability   01/23/2023: 14%  MUSCLE LENGTH: Bilateral hamstring limitation   POSTURE:             Rounded shoulders, decreased lumbar lordosis   PALPATION: Tender to palpation bilateral lumbar and thoracic paraspinals   LUMBAR ROM:    AROM eval  Flexion WFL  Extension WFL  Right lateral flexion WFL  Left lateral flexion WFL  Right rotation WFL  Left rotation WFL   (Blank  rows = not tested)   Flexion, L > R side bend, and L rotation reports tightness  01/23/2023: no increased pain reported with active motion   LOWER EXTREMITY ROM:                          LE ROM grossly WFL and non-painful   LOWER EXTREMITY MMT:     MMT Right eval Left eval Rt / Lt 02/06/23  Hip flexion 4 4   Hip extension 4 4 4  / 4  Hip abduction 4 4 4  / 4  Hip adduction       Hip internal rotation       Hip external rotation       Knee flexion 5 5   Knee extension 5 5   Ankle dorsiflexion       Ankle plantarflexion       Ankle inversion       Ankle eversion        (Blank rows = not tested)   UPPER EXTREMITY MMT:   MMT Right eval Left eval Rt / Lt 01/16/23 Rt / Lt 01/23/23 Rt / Lt 02/06/23  Shoulder flexion         Shoulder extension         Shoulder abduction         Shoulder adduction         Shoulder extension         Shoulder internal rotation         Shoulder external rotation         Middle trapezius 4- 4- 4- / 4- 4 / 4 4 / 4  Lower trapezius 4- 4- 4- / 4- 4- / 4- 4- / 4-  Elbow flexion         Elbow extension         Wrist flexion         Wrist extension         Wrist ulnar deviation         Wrist radial deviation         Wrist pronation         Wrist supination         Grip strength          (Blank rows = not tested)   LUMBAR SPECIAL TESTS:  Lumbar radicular testing negative   FUNCTIONAL TESTS:  DLLT: loss of lumbar control and report of pain at 45 deg  01/23/2023: loss of  lumbar control at 35 deg, no pain reported  01/30/2023: loss of lumbar control at 35 deg, no pain reported  02/06/2023: loss of lumbar control at 35 deg, no pain reported Lifting: increased lumbar flexion with lifting technique  01/23/2023: patient continues to demonstrate increased lumbar flexion with lifting 02/06/2023: patient continues to demonstrate increased lumbar flexion with lifting       TODAY'S TREATMENT:    [facility] Adult PT Treatment:                                                DATE: 02/06/2023 Therapeutic Exercise: UBE L3 x 4 min (fwd/bwd) while taking subjective Lumbar rotation stretch x 3 each Figure-4 bridge 2 x 10 each Dead bug with physioball 2 x 10 Modified side plank on knees 3 x 30 sec each Prone alternating  opposite leg and arm lift 2 x 10 Modified front plank 3 x 15 sec Hex bar deadlift 75# 3 x 10 Low row machine 45# 3 x 12 Lat pull down machine 45# 3 x 10 Pallof press FM 20# 2 x 10 each, blue band x 10 each   [facility] Adult PT Treatment:                                                DATE: 01/30/2023 Therapeutic Exercise: Recumbent bike L3 x 5 min while taking subjective LTR with legs on physioball x 5 Figure-4 bridge 2 x 10 each 90-90 alternating leg extensions 2 x 10 Modified side plank on knees 2 x 20 sec each Bird dog 2 x 10 Low row machine 35# 2 x 12 Lat pull down machine 35# 2 x 10 Pallof press FM 13# 2 x 10 each Hip hinge using dowel sitting and standing x 10 each Hip hinge touching butt to squat pole x 10 Deadlift with 25# 2 x 10 - max cueing for hip hinge technique and maintaining neutral lumbar spine  [facility] Adult PT Treatment:                                                DATE: 01/23/2023 Therapeutic Exercise: Recumbent bike L3 x 4 min while taking subjective Supine hamstring stretch with strap Figure-4 bridge 2 x 10 each 90-90 alternating leg extensions 2 x 10 Modified side plank on knees 4 x 10 sec each Bird dog 2 x 10 Prone Y 2 x 10 each Hip  hinge using dowel x x10 Hip hinge sliding 15# bar down thighs to knees Deadlift with 45# 2 x 10 - max cueing for hip hinge technique and maintaining neutral lumbar spine   PATIENT EDUCATION:  Education details: POC extension, HEP update Person educated: Patient Education method: Explanation, Demonstration, Tactile cues, Verbal cues, Handout Education comprehension: verbalized understanding, returned demonstration, verbal cues required, tactile cues required, and needs further education   HOME EXERCISE PROGRAM: Access Code: [email or web address]      ASSESSMENT: CLINICAL IMPRESSION: Patient tolerated therapy well with no adverse effects. He continues to demonstrate limitations in his core and postural strength, and reports limitations in his functional ability with pain while performing work related tasks. Therapy continues to focus primarily on progressing his strength with good tolerance, but he reported that side planks bothered his shoulder so will discontinue that exercise. He was able to progress with lifting but does continue to exhibit increased lumbar flexion with lifting. Updated HEP this visit to progress core stabilization. Patient would benefit from continued skilled PT to progress his mobility and strength in order to reduce pain and maximize functional ability, so will extend PT POC for 4 more weeks.     OBJECTIVE IMPAIRMENTS: decreased activity tolerance, decreased strength, impaired flexibility, improper body mechanics, postural dysfunction, and pain.    ACTIVITY LIMITATIONS: lifting, bending, and sitting   PARTICIPATION LIMITATIONS: cleaning and driving   PERSONAL FACTORS: Past/current experiences and Time since onset of injury/illness/exacerbation are also affecting patient's functional outcome.      GOALS: Goals reviewed with patient? Yes   SHORT TERM GOALS: Target date: = LTG   Patient will be I with  initial HEP in order to progress with therapy. Baseline: HEP provided at  eval 01/23/2023: independent with initial HEP Goal status: MET   2.  Patient will report pain/tightness </= 1/10 with activity and sitting in order to reduce functional limitations Baseline: 3/10 01/23/2023: 4-5/10 pain 02/06/2023: 4/10 low back pain Goal status: IN PROGRESS   3.  Patient will demonstrate proper lifting mechanics to reduce low back pain and tightness with lifting and performing household tasks Baseline: increased lumbar flexion with lifting 01/23/2023: patient continues to demonstrate increased lumbar flexion with lifting 02/06/2023: patient continues to demonstrate increased lumbar flexion with lifting Goal status: IN PROGRESS   LONG TERM GOALS: Target date: 03/06/2023   Patient will be I with final HEP to maintain progress from PT. Baseline: HEP provided at eval 01/23/2023: progressing HEP 02/06/2023: progressing Goal status: IN PROGRESS   2.  Patient will report </= 5% disability on modified ODI in order to indicate improved functional ability with lifting and performing household tasks Baseline: 16% 01/23/2023: 14% 02/06/2023: 14% Goal status: PARTIALLY MET   3.  Patient will report no pain or tightness with spinal AROM in order to improve ability to perform household tasks and activities that require bending Baseline: patient reports pain and tightness with lumbar flexion, left > right side bend, and left rotation 01/23/2023: lumbar motion WFL and no increased pain reported with movement Goal status: MET   4.  Patient will demonstrate DLLT </= 30 deg and periscapular muscle strength >/= 4/5 MMT in order to indicate improve core and posture control in order to reduce pain with lifting and improve sitting tolerance Baseline: DLLT 45 deg and periscapular muscle strength 4-/5 MMT 01/23/2023: DLLT 35 deg and lower trap strength 4-/5 MMT 02/06/2023: DLLT at 35 deg Goal status: PARTIALLY MET     PLAN: PT FREQUENCY: 1x/week   PT DURATION: 4 weeks   PLANNED INTERVENTIONS:  Therapeutic exercises, Therapeutic activity, Neuromuscular re-education, Balance training, Gait training, Patient/Family education, Self Care, Joint mobilization, Joint manipulation, Dry Needling, Spinal manipulation, Spinal mobilization, Taping, Manual therapy, and Re-evaluation.   PLAN FOR NEXT SESSION: Review HEP and progress PRN, manual / dry needling for lumbar paraspinal and periscapular muscles, progress core and postural strength and control   Rosana Hoes, PT, DPT, LAT, ATC 02/06/23  8:45 AM Phone: [phone] Fax: [phone]  PHYSICAL THERAPY DISCHARGE SUMMARY  Visits from Start of Care: 6  Current functional level related to goals / functional outcomes: See above   Remaining deficits: See above   Education / Equipment: HEP   Patient agrees to discharge. Patient goals were not met. Patient is being discharged due to not returning since the last visit.  Rosana Hoes, PT, DPT, LAT, ATC 03/24/23  10:53 AM Phone: [phone] Fax: [phone]

## 2023-02-06 ENCOUNTER — Ambulatory Visit: Payer: Medicaid Other | Admitting: Physical Therapy

## 2023-02-06 ENCOUNTER — Encounter: Payer: Self-pay | Admitting: Physical Therapy

## 2023-02-06 ENCOUNTER — Other Ambulatory Visit: Payer: Self-pay

## 2023-02-06 DIAGNOSIS — M546 Pain in thoracic spine: Secondary | ICD-10-CM | POA: Diagnosis not present

## 2023-02-06 DIAGNOSIS — M5459 Other low back pain: Secondary | ICD-10-CM

## 2023-02-06 DIAGNOSIS — M6281 Muscle weakness (generalized): Secondary | ICD-10-CM | POA: Diagnosis not present

## 2023-02-06 NOTE — Patient Instructions (Signed)
Access Code: [email or web address] URL: [email or web address] Date: 02/06/2023 Prepared by: Rosana Hoes  Exercises - Supine Lower Trunk Rotation  - 1-2 x daily - 10 reps - 5 seconds hold - Sidelying Thoracic Lumbar Rotation  - 1-2 x daily - 10 reps - 5 seconds hold - Child's Pose Stretch  - 1 x daily - 3 reps - 20 seconds hold - Seated Table Hamstring Stretch  - 1 x daily - 3 reps - 20 seconds hold - Supine Bridge  - 1 x daily - 2 sets - 10 reps - 3 seconds hold - Supine 90/90 with Leg Extensions  - 1 x daily - 2 sets - 10 reps - Prone Scapular Slide with Shoulder Extension  - 1 x daily - 2 sets - 10 reps - Prone T  - 1 x daily - 2 sets - 10 reps - Prone Y  - 1 x daily - 2 sets - 10 reps - Plank on Knees  - 1 x daily - 3 reps - 20 seconds hold - Banded Row  - 1 x daily - 3 sets - 10 reps - Standing Anti-Rotation Press with Anchored Resistance  - 1 x daily - 3 sets - 10 reps
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
## 2023-02-12 ENCOUNTER — Ambulatory Visit (INDEPENDENT_AMBULATORY_CARE_PROVIDER_SITE_OTHER): Payer: Medicaid Other | Admitting: Sports Medicine

## 2023-02-12 VITALS — BP 120/82 | HR 57 | Ht 75.0 in | Wt 184.0 lb

## 2023-02-12 DIAGNOSIS — M9901 Segmental and somatic dysfunction of cervical region: Secondary | ICD-10-CM | POA: Diagnosis not present

## 2023-02-12 DIAGNOSIS — G8929 Other chronic pain: Secondary | ICD-10-CM | POA: Diagnosis not present

## 2023-02-12 DIAGNOSIS — M9902 Segmental and somatic dysfunction of thoracic region: Secondary | ICD-10-CM

## 2023-02-12 DIAGNOSIS — M545 Low back pain, unspecified: Secondary | ICD-10-CM | POA: Diagnosis not present

## 2023-02-12 DIAGNOSIS — M9905 Segmental and somatic dysfunction of pelvic region: Secondary | ICD-10-CM

## 2023-02-12 DIAGNOSIS — M546 Pain in thoracic spine: Secondary | ICD-10-CM

## 2023-02-12 DIAGNOSIS — M9903 Segmental and somatic dysfunction of lumbar region: Secondary | ICD-10-CM | POA: Diagnosis not present

## 2023-02-12 DIAGNOSIS — M9908 Segmental and somatic dysfunction of rib cage: Secondary | ICD-10-CM | POA: Diagnosis not present

## 2023-02-12 NOTE — Patient Instructions (Signed)
Good to see you   

## 2023-02-14 ENCOUNTER — Ambulatory Visit: Payer: Medicaid Other | Admitting: Physical Therapy

## 2023-02-20 ENCOUNTER — Ambulatory Visit: Payer: Medicaid Other | Attending: Sports Medicine | Admitting: Physical Therapy
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
## 2023-02-26 ENCOUNTER — Ambulatory Visit: Payer: Medicaid Other | Admitting: Sports Medicine

## 2023-02-26 VITALS — BP 120/80 | HR 51 | Ht 75.0 in | Wt 179.0 lb

## 2023-02-26 DIAGNOSIS — M9908 Segmental and somatic dysfunction of rib cage: Secondary | ICD-10-CM | POA: Diagnosis not present

## 2023-02-26 DIAGNOSIS — M9902 Segmental and somatic dysfunction of thoracic region: Secondary | ICD-10-CM

## 2023-02-26 DIAGNOSIS — M9905 Segmental and somatic dysfunction of pelvic region: Secondary | ICD-10-CM

## 2023-02-26 DIAGNOSIS — M9903 Segmental and somatic dysfunction of lumbar region: Secondary | ICD-10-CM | POA: Diagnosis not present

## 2023-02-26 DIAGNOSIS — M546 Pain in thoracic spine: Secondary | ICD-10-CM | POA: Diagnosis not present

## 2023-02-26 DIAGNOSIS — M545 Low back pain, unspecified: Secondary | ICD-10-CM

## 2023-02-26 DIAGNOSIS — G8929 Other chronic pain: Secondary | ICD-10-CM

## 2023-02-26 DIAGNOSIS — M9901 Segmental and somatic dysfunction of cervical region: Secondary | ICD-10-CM | POA: Diagnosis not present

## 2023-02-26 NOTE — Patient Instructions (Signed)
Good to see you   

## 2023-02-28 ENCOUNTER — Ambulatory Visit: Payer: Medicaid Other | Admitting: Physical Therapy

## 2023-03-03 ENCOUNTER — Telehealth: Payer: Self-pay | Admitting: Physical Therapy

## 2023-03-03 NOTE — Telephone Encounter (Signed)
Attempted to contact patient due to two consecutive no shows. No answer and unable to leave VM.  Rosana Hoes, PT, DPT, LAT, ATC 03/03/23  9:26 AM Phone: [phone] Fax: [phone]
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
## 2023-03-12 ENCOUNTER — Ambulatory Visit: Payer: Medicaid Other | Admitting: Sports Medicine

## 2023-03-12 VITALS — BP 122/82 | HR 47 | Ht 75.0 in | Wt 178.0 lb

## 2023-03-12 DIAGNOSIS — M542 Cervicalgia: Secondary | ICD-10-CM | POA: Diagnosis not present

## 2023-03-12 DIAGNOSIS — M9903 Segmental and somatic dysfunction of lumbar region: Secondary | ICD-10-CM | POA: Diagnosis not present

## 2023-03-12 DIAGNOSIS — G8929 Other chronic pain: Secondary | ICD-10-CM

## 2023-03-12 DIAGNOSIS — M546 Pain in thoracic spine: Secondary | ICD-10-CM

## 2023-03-12 DIAGNOSIS — M9905 Segmental and somatic dysfunction of pelvic region: Secondary | ICD-10-CM | POA: Diagnosis not present

## 2023-03-12 DIAGNOSIS — M9908 Segmental and somatic dysfunction of rib cage: Secondary | ICD-10-CM

## 2023-03-12 DIAGNOSIS — M9902 Segmental and somatic dysfunction of thoracic region: Secondary | ICD-10-CM

## 2023-03-12 DIAGNOSIS — M9901 Segmental and somatic dysfunction of cervical region: Secondary | ICD-10-CM | POA: Diagnosis not present

## 2023-03-19 DIAGNOSIS — M545 Low back pain, unspecified: Secondary | ICD-10-CM | POA: Diagnosis not present

## 2023-03-26 ENCOUNTER — Ambulatory Visit: Payer: Medicaid Other | Admitting: Sports Medicine

## 2023-03-26 VITALS — BP 132/80 | HR 76 | Ht 75.0 in | Wt 178.0 lb

## 2023-03-26 DIAGNOSIS — M9903 Segmental and somatic dysfunction of lumbar region: Secondary | ICD-10-CM | POA: Diagnosis not present

## 2023-03-26 DIAGNOSIS — M545 Low back pain, unspecified: Secondary | ICD-10-CM | POA: Diagnosis not present

## 2023-03-26 DIAGNOSIS — G8929 Other chronic pain: Secondary | ICD-10-CM | POA: Diagnosis not present
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
## 2023-03-26 NOTE — Patient Instructions (Addendum)
PCP - [phone] Dr. Okey Dupre, Dr. Alvy Bimler, or Dr. Yetta Barre  Work note provided  CT scan lumbar Follow up 4 days after CT scan

## 2023-04-02 ENCOUNTER — Ambulatory Visit
Admission: RE | Admit: 2023-04-02 | Discharge: 2023-04-02 | Disposition: A | Discharge status: Home / Self Care | Payer: Medicaid Other | Source: Ambulatory Visit | Attending: Sports Medicine | Admitting: Sports Medicine

## 2023-04-02 DIAGNOSIS — M47816 Spondylosis without myelopathy or radiculopathy, lumbar region: Secondary | ICD-10-CM | POA: Diagnosis not present

## 2023-04-02 DIAGNOSIS — M9903 Segmental and somatic dysfunction of lumbar region: Secondary | ICD-10-CM

## 2023-04-02 DIAGNOSIS — M5126 Other intervertebral disc displacement, lumbar region: Secondary | ICD-10-CM | POA: Diagnosis not present

## 2023-04-07 ENCOUNTER — Other Ambulatory Visit: Payer: Self-pay

## 2023-04-07 ENCOUNTER — Encounter (HOSPITAL_BASED_OUTPATIENT_CLINIC_OR_DEPARTMENT_OTHER): Payer: Self-pay | Admitting: Emergency Medicine

## 2023-04-07 ENCOUNTER — Emergency Department (HOSPITAL_BASED_OUTPATIENT_CLINIC_OR_DEPARTMENT_OTHER): Payer: Medicaid Other

## 2023-04-07 ENCOUNTER — Emergency Department (HOSPITAL_BASED_OUTPATIENT_CLINIC_OR_DEPARTMENT_OTHER)
Admission: EM | Admit: 2023-04-07 | Discharge: 2023-04-07 | Disposition: A | Discharge status: Home / Self Care | Payer: Medicaid Other | Attending: Emergency Medicine | Admitting: Emergency Medicine

## 2023-04-07 DIAGNOSIS — R569 Unspecified convulsions: Secondary | ICD-10-CM | POA: Diagnosis not present

## 2023-04-07 LAB — BASIC METABOLIC PANEL
Anion gap: 6 (ref 5–15)
BUN: 10 mg/dL (ref 6–20)
CO2: 28 mmol/L (ref 22–32)
Calcium: 10.1 mg/dL (ref 8.9–10.3)
Chloride: 104 mmol/L (ref 98–111)
Creatinine, Ser: 1.08 mg/dL (ref 0.61–1.24)
GFR, Estimated: >60 mL/min (ref >60)
Glucose, Bld: 101 mg/dL — ABNORMAL HIGH (ref 70–99)
Potassium: 4 mmol/L (ref 3.5–5.1)
Sodium: 138 mmol/L (ref 135–145)

## 2023-04-07 LAB — CBC
HCT: 41.9 % (ref 39.0–52.0)
Hemoglobin: 13.8 g/dL (ref 13.0–17.0)
MCH: 27.5 pg (ref 26.0–34.0)
MCHC: 32.9 g/dL (ref 30.0–36.0)
MCV: 83.5 fL (ref 80.0–100.0)
Platelets: 162 10*3/uL (ref 150–400)
RBC: 5.02 MIL/uL (ref 4.22–5.81)
RDW: 12.4 % (ref 11.5–15.5)
WBC: 6.4 10*3/uL (ref 4.0–10.5)
nRBC: 0 % (ref 0.0–0.2)

## 2023-04-07 NOTE — Discharge Instructions (Signed)
You have been seen in the emergency department today for a likely seizure.  Your workup today including labs are within normal limits.  Please follow up with your doctor as soon as possible regarding today's emergency department visit and your likely seizure.  You will also need to follow up with a neurologist as soon as possible, please call for appointment.  If you have been prescribed a medication for your seizures, please take this medication as prescribed.  As we have discussed it is very important that you DO NOT drive until you have been seen and cleared by your neurologist.  Please drink plenty of fluids, get plenty of sleep and avoid any alcohol or drug use.  Return to the emergency department if you have any further seizures, develop any weakness/numbness of any arm/leg, confusion, slurred speech, or sudden/severe headache.  

## 2023-04-07 NOTE — ED Provider Notes (Signed)
Emergency Department Provider Note   I have reviewed the triage vital signs and the nursing notes.   HISTORY  Chief Complaint Seizures   HPI Joshua Duncan is a 27 y.o. male with past history of asthma presents emergency department with concern for seizure.  Patient tells me that he has had 1 possible seizure in the past although no clear diagnosis was made at that time.  Recently, he has been taking a memory supplement and feels like he is having increased feelings of dj vu.  He notes that when he begins to feel this way he starts to feel tingly and somewhat lightheaded.  Today, his girlfriend witnessed him have an approximately 15-second episode of what sounds like generalized tonic-clonic seizure activity.  He had a brief postictal phase.  No tongue injury or loss of bladder control.  Denies any alcohol or illicit drug use.  He does use marijuana.  No head injury.   Past Medical History:  Diagnosis Date   Asthma     Review of Systems  Constitutional: No fever/chills Cardiovascular: Denies chest pain. Respiratory: Denies shortness of breath. Gastrointestinal: No abdominal pain. Skin: Negative for rash. Neurological: Negative for headaches, focal weakness or numbness. ? Seizure today.    ____________________________________________   PHYSICAL EXAM:  VITAL SIGNS: ED Triage Vitals  Enc Vitals Group     BP 04/07/23 1058 (!) 159/72     Pulse Rate 04/07/23 1058 (!) 55     Resp 04/07/23 1058 15     Temp 04/07/23 1058 97.7 F (36.5 C)     Temp Source 04/07/23 1058 Temporal     SpO2 04/07/23 1058 99 %     Weight 04/07/23 1057 180 lb (81.6 kg)     Height 04/07/23 1057 6\' 3"  (1.905 m)   Constitutional: Alert and oriented. Well appearing and in no acute distress. Eyes: Conjunctivae are normal. PERRL. EOMI. Head: Atraumatic. Nose: No congestion/rhinnorhea. Mouth/Throat: Mucous membranes are moist.  Oropharynx non-erythematous. No tongue injury.  Neck: No stridor.   Cardiovascular: Normal rate, regular rhythm. Good peripheral circulation. Grossly normal heart sounds.   Respiratory: Normal respiratory effort.  No retractions. Lungs CTAB. Gastrointestinal: Soft and nontender. No distention.  Musculoskeletal: No lower extremity tenderness nor edema. No gross deformities of extremities. Neurologic:  Normal speech and language. No gross focal neurologic deficits are appreciated.  Skin:  Skin is warm, dry and intact. No rash noted.   ____________________________________________   LABS (all labs ordered are listed, but only abnormal results are displayed)  Labs Reviewed  BASIC METABOLIC PANEL - Abnormal; Notable for the following components:      Result Value   Glucose, Bld 101 (*)    All other components within normal limits  CBC   ____________________________________________  RADIOLOGY  CT Head Wo Contrast  Result Date: 04/07/2023 CLINICAL DATA:  Seizure, new-onset, no history of trauma EXAM: CT HEAD WITHOUT CONTRAST TECHNIQUE: Contiguous axial images were obtained from the base of the skull through the vertex without intravenous contrast. RADIATION DOSE REDUCTION: This exam was performed according to the departmental dose-optimization program which includes automated exposure control, adjustment of the mA and/or kV according to patient size and/or use of iterative reconstruction technique. COMPARISON:  None Available. FINDINGS: Brain: No evidence of acute infarction, hemorrhage, hydrocephalus, extra-axial collection or mass lesion/mass effect. Cavum septum pellucidum et vergae Vascular: No hyperdense vessel or unexpected calcification. Skull: Normal. Negative for fracture or focal lesion. Sinuses/Orbits: No middle ear or mastoid effusion. Paranasal sinuses are  notable for mild mucosal thickening in the posterior ethmoid air cells bilaterally. Orbits are unremarkable. Other: None. IMPRESSION: No acute intracranial abnormality. Electronically Signed   By:  Lorenza Cambridge M.D.   On: 04/07/2023 11:32    ____________________________________________   PROCEDURES  Procedure(s) performed:   Procedures  None  ____________________________________________   INITIAL IMPRESSION / ASSESSMENT AND PLAN / ED COURSE  Pertinent labs & imaging results that were available during my care of the patient were reviewed by me and considered in my medical decision making (see chart for details).   This patient is Presenting for Evaluation of AMS, which does require a range of treatment options, and is a complaint that involves a high risk of morbidity and mortality.  The Differential Diagnoses includes but is not exclusive to alcohol, illicit or prescription medications, intracranial pathology such as stroke, intracerebral hemorrhage, fever or infectious causes including sepsis, hypoxemia, uremia, trauma, endocrine related disorders such as diabetes, hypoglycemia, thyroid-related diseases, etc.    Clinical Laboratory Tests Ordered, included CBC without leukocytosis or anemia.  No acute kidney injury.  Radiologic Tests Ordered, included CT head. I independently interpreted the images and agree with radiology interpretation.   Cardiac Monitor Tracing which shows NSR.    Social Determinants of Health Risk denies EtOH use. Patient uses THC regularly.   Medical Decision Making: Summary:  Patient presents emergency department for evaluation of seizure-like activity.  No stigmata of seizure.  No evidence of status.  CT imaging of the head and labs are reassuring.  I discussed with patient that he is not allowed to drive until cleared to do so by neurology and have placed a referral for neurology to facilitate follow-up.  This may be the second episode although the history surrounding the first is spotty. Patient is back to baseline, up, and ambulatory in the ED. Stable for discharge.   Patient's presentation is most consistent with acute presentation with potential  threat to life or bodily function.   Disposition: discharge.   ____________________________________________  FINAL CLINICAL IMPRESSION(S) / ED DIAGNOSES  Final diagnoses:  Seizure-like activity (HCC)    Note:  This document was prepared using Dragon voice recognition software and may include unintentional dictation errors.  Alona Bene, MD, [facility]    Ceceilia Cephus, Arlyss Repress, MD 04/11/23 [phone]
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
## 2023-04-07 NOTE — ED Triage Notes (Signed)
Pt arrives to ED with c/o seizure x2 days. He notes he has started a new "memory pill" that he thinks could have caused the seizure. The seizure was witnessed by his girlfriend who noted pt went unconsciousness and started shaking for 15 seconds.

## 2023-04-07 NOTE — ED Notes (Signed)
Pt states his girlfriend witnessed him having seizure like activity. Pt advises hx of 1 seizure, approx 2 years ago, without explanation or diagnosis. Pt is asymptomatic and without complaints at this time. No apparent distress, CAOx4, Ambulatory without assistance, skin warm and dry.

## 2023-04-08 ENCOUNTER — Ambulatory Visit: Payer: Medicaid Other | Admitting: Sports Medicine

## 2023-04-08 VITALS — BP 118/80 | HR 51 | Ht 75.0 in | Wt 180.0 lb

## 2023-04-08 DIAGNOSIS — M9905 Segmental and somatic dysfunction of pelvic region: Secondary | ICD-10-CM

## 2023-04-08 DIAGNOSIS — M545 Low back pain, unspecified: Secondary | ICD-10-CM | POA: Diagnosis not present

## 2023-04-08 DIAGNOSIS — R569 Unspecified convulsions: Secondary | ICD-10-CM

## 2023-04-08 DIAGNOSIS — M542 Cervicalgia: Secondary | ICD-10-CM

## 2023-04-08 DIAGNOSIS — M9901 Segmental and somatic dysfunction of cervical region: Secondary | ICD-10-CM | POA: Diagnosis not present

## 2023-04-08 DIAGNOSIS — M9908 Segmental and somatic dysfunction of rib cage: Secondary | ICD-10-CM | POA: Diagnosis not present

## 2023-04-08 DIAGNOSIS — G8929 Other chronic pain: Secondary | ICD-10-CM | POA: Diagnosis not present

## 2023-04-08 DIAGNOSIS — M9902 Segmental and somatic dysfunction of thoracic region: Secondary | ICD-10-CM

## 2023-04-08 DIAGNOSIS — M9903 Segmental and somatic dysfunction of lumbar region: Secondary | ICD-10-CM

## 2023-04-08 NOTE — Patient Instructions (Signed)
Work note provided  Cleared to return to work with out physical restrictions. No driving until evaluated by neurology  PT referral  4 week follow up

## 2023-04-09 ENCOUNTER — Ambulatory Visit: Payer: Medicaid Other | Admitting: Sports Medicine

## 2023-05-01 ENCOUNTER — Ambulatory Visit: Payer: Medicaid Other | Attending: Sports Medicine | Admitting: Physical Therapy

## 2023-05-01 DIAGNOSIS — M546 Pain in thoracic spine: Secondary | ICD-10-CM | POA: Insufficient documentation

## 2023-05-01 DIAGNOSIS — M6281 Muscle weakness (generalized): Secondary | ICD-10-CM | POA: Insufficient documentation

## 2023-05-01 DIAGNOSIS — M5459 Other low back pain: Secondary | ICD-10-CM | POA: Insufficient documentation
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
## 2023-05-06 ENCOUNTER — Ambulatory Visit: Payer: Medicaid Other | Admitting: Sports Medicine

## 2023-05-06 VITALS — BP 108/72 | HR 51 | Ht 75.0 in | Wt 189.0 lb

## 2023-05-06 DIAGNOSIS — G8929 Other chronic pain: Secondary | ICD-10-CM

## 2023-05-06 DIAGNOSIS — M545 Low back pain, unspecified: Secondary | ICD-10-CM

## 2023-05-06 NOTE — Patient Instructions (Signed)
Low back HEP  1 week follow up

## 2023-05-07 ENCOUNTER — Ambulatory Visit: Payer: Medicaid Other

## 2023-05-07 ENCOUNTER — Other Ambulatory Visit: Payer: Self-pay

## 2023-05-07 DIAGNOSIS — M5459 Other low back pain: Secondary | ICD-10-CM | POA: Diagnosis not present

## 2023-05-07 DIAGNOSIS — M546 Pain in thoracic spine: Secondary | ICD-10-CM | POA: Diagnosis not present

## 2023-05-07 DIAGNOSIS — M6281 Muscle weakness (generalized): Secondary | ICD-10-CM
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
## 2023-05-13 ENCOUNTER — Ambulatory Visit: Payer: Medicaid Other | Admitting: Sports Medicine

## 2023-05-15 ENCOUNTER — Ambulatory Visit: Payer: Medicaid Other | Admitting: Physical Therapy
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
## 2023-05-22 ENCOUNTER — Ambulatory Visit: Payer: Medicaid Other

## 2023-05-26 ENCOUNTER — Ambulatory Visit: Payer: Medicaid Other | Admitting: Sports Medicine

## 2023-05-26 VITALS — BP 126/84 | HR 50 | Ht 75.0 in | Wt 178.0 lb

## 2023-05-26 DIAGNOSIS — M542 Cervicalgia: Secondary | ICD-10-CM | POA: Diagnosis not present

## 2023-05-26 DIAGNOSIS — G8929 Other chronic pain: Secondary | ICD-10-CM | POA: Diagnosis not present

## 2023-05-26 DIAGNOSIS — M9902 Segmental and somatic dysfunction of thoracic region: Secondary | ICD-10-CM | POA: Diagnosis not present

## 2023-05-26 DIAGNOSIS — M545 Low back pain, unspecified: Secondary | ICD-10-CM | POA: Diagnosis not present

## 2023-05-26 DIAGNOSIS — M9903 Segmental and somatic dysfunction of lumbar region: Secondary | ICD-10-CM | POA: Diagnosis not present

## 2023-05-26 DIAGNOSIS — M9908 Segmental and somatic dysfunction of rib cage: Secondary | ICD-10-CM | POA: Diagnosis not present

## 2023-05-26 DIAGNOSIS — M9905 Segmental and somatic dysfunction of pelvic region: Secondary | ICD-10-CM | POA: Diagnosis not present

## 2023-05-26 DIAGNOSIS — M9901 Segmental and somatic dysfunction of cervical region: Secondary | ICD-10-CM | POA: Diagnosis not present

## 2023-05-27 ENCOUNTER — Ambulatory Visit: Payer: Medicaid Other

## 2023-05-29 ENCOUNTER — Ambulatory Visit: Payer: Medicaid Other
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
## 2023-06-26 ENCOUNTER — Ambulatory Visit: Payer: Medicaid Other | Admitting: Sports Medicine

## 2023-07-01 ENCOUNTER — Encounter: Payer: Self-pay | Admitting: Neurology

## 2023-07-01 ENCOUNTER — Ambulatory Visit (INDEPENDENT_AMBULATORY_CARE_PROVIDER_SITE_OTHER): Payer: Medicaid Other | Admitting: Neurology

## 2023-07-01 VITALS — BP 122/76 | Ht 74.0 in | Wt 178.0 lb

## 2023-07-01 DIAGNOSIS — R569 Unspecified convulsions: Secondary | ICD-10-CM | POA: Diagnosis not present
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
## 2023-07-01 NOTE — Patient Instructions (Addendum)
Routine EEG, I will contact you to go over the result If normal, we will continue to observe you but if abnormal will discuss MRI and starting antiseizure medication Continue to follow with PCP Discussed driving restriction for a total of 6 months  Contact me if you have another event, dj vu or seizures Return as needed

## 2023-07-02 ENCOUNTER — Telehealth (HOSPITAL_COMMUNITY): Payer: Self-pay

## 2023-07-02 ENCOUNTER — Ambulatory Visit (HOSPITAL_COMMUNITY)
Admission: EM | Admit: 2023-07-02 | Discharge: 2023-07-02 | Disposition: A | Discharge status: Home / Self Care | Payer: Medicaid Other

## 2023-07-02 NOTE — Telephone Encounter (Signed)
Advice - Telephone call with patient, after he left a message he was upset that he came in earlier today and was not seen after waiting for what he reported 1.5 hours. Explained patient walk-in hours to patient and he agreed to return during walk-in times for the outpatient to be started with services.

## 2023-07-02 NOTE — Telephone Encounter (Signed)
PT came up from downstairs asking for walk in at 11:10am for a mental health evaluation.  He was visibly upset (shaking and angry). We let him know that all of our walk in times were over and he would need to be here at 7am. We offered to  make him an appointment. He was very confused between [facility] and outpatient. Once we started to explain the difference, he became even more upset and walked away from [place] and left.

## 2023-07-02 NOTE — Progress Notes (Signed)
   07/02/23 0927  [facility] Triage Screening (Walk-ins at [facility] only)  How Did You Hear About [place]? Self  What Is the Reason for Your Visit/Call Today? Pt presents to [facility] unaccompanied. Pt reports that he is here to get a mental health evaluation. Pt states, "I have been watching [employer] and really think I have Bipolar and BPD." Pt reports that he suffers from insomnia, irritability. Pt reports he is not currently taking medications at this time. Pt is open to taking medications depending on what is offered. Pt denies having a therapist as well and is interested in finding one that will help him work through his current life stressors. Pt is unable to recall what is stressing him out at this time, but does mention that he feels controlling and manipulative at times. Pt reports that he smoked one blunt last night and this morning. Pt denies alcohol use, SI, HI and AVH.  How Long Has This Been Causing You Problems? > than 6 months  Have You Recently Had Any Thoughts About Hurting Yourself? No  Are You Planning to Commit Suicide/Harm Yourself At This time? No  Have you Recently Had Thoughts About Hurting Someone Karolee Ohs? No  Are You Planning To Harm Someone At This Time? No  Are you currently experiencing any auditory, visual or other hallucinations? No  Have You Used Any Alcohol or Drugs in the Past 24 Hours? Yes  How long ago did you use Drugs or Alcohol? last night and this morning  What Did You Use and How Much? one blunt this morning and last night  Do you have any current medical co-morbidities that require immediate attention? No  Clinician description of patient physical appearance/behavior: calm, coherent, cooperative  What Do You Feel Would Help You the Most Today? Treatment for Depression or other mood problem;Stress Management;Social Support;Medication(s)  If access to [facility] was not available, would you have sought care in the Emergency Department? No  Determination of Need Routine (7 days)   Options For Referral Medication Management;Outpatient Therapy

## 2023-07-10 ENCOUNTER — Ambulatory Visit (INDEPENDENT_AMBULATORY_CARE_PROVIDER_SITE_OTHER): Payer: Medicaid Other | Admitting: Neurology

## 2023-07-10 DIAGNOSIS — R569 Unspecified convulsions: Secondary | ICD-10-CM

## 2023-07-11 NOTE — Procedures (Signed)
   History:  27 year old man with seizure   EEG classification:  Awake and asleep  Duration: 26 minutes   Technical aspects: This EEG study was done with scalp electrodes positioned according to the 10-20 International system of electrode placement. Electrical activity was reviewed with band pass filter of 1-70Hz , sensitivity of 7 uV/mm, display speed of 1mm/sec with a 60Hz  notched filter applied as appropriate. EEG data were recorded continuously and digitally stored.   Description of the recording: The background rhythms of this recording consists of a fairly well modulated medium amplitude background activity of 10 Hz. As the record progresses, the patient initially is in the waking state, but appears to enter the early stage II sleep during the recording, with rudimentary sleep spindles and vertex sharp wave activity seen. During the wakeful state, photic stimulation was performed, and no abnormal responses were seen. Hyperventilation was also performed, no abnormal response seen. No epileptiform discharges seen during this recording but there were sharply contoured waves seen in the right occipital region. There was no focal slowing.   Abnormality: None   Impression: This is essentially a normal EEG recording in the waking and sleeping state. No evidence of interictal epileptiform discharges. Normal EEGs, however, do not rule out epilepsy.    Windell Norfolk, MD [facility]

## 2024-05-30 ENCOUNTER — Ambulatory Visit
Admission: EM | Admit: 2024-05-30 | Discharge: 2024-05-30 | Disposition: A | Discharge status: Home / Self Care | Attending: Family Medicine | Admitting: Family Medicine

## 2024-05-30 DIAGNOSIS — L309 Dermatitis, unspecified: Secondary | ICD-10-CM

## 2024-05-30 MED ORDER — HYDROCORTISONE 1 % EX OINT: 1.0000 | TOPICAL_OINTMENT | Freq: Two times a day (BID) | CUTANEOUS | 0 refills | Status: AC | PRN

## 2024-05-30 MED ORDER — PREDNISONE 20 MG PO TABS: 40.0000 mg | ORAL_TABLET | Freq: Every day | ORAL | 0 refills | Status: AC

## 2024-05-30 NOTE — ED Triage Notes (Signed)
 Pt presents to UC for c/o ithcy rash on face x3 days.  Used A&D ointment w/o relief. 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

## 2024-05-30 NOTE — ED Provider Notes (Signed)
 [facility]    CSN: [id number] Arrival date & time: 05/30/24  1117      History   Chief Complaint No chief complaint on file.   HPI Joshua Duncan is a 28 y.o. male.   Presenting today with itchy rash to the face and neck that started about 3 days ago and has spread a bit since onset.  Denies new exposures including new medications, new supplements, throat itching or swelling, chest tightness, shortness of breath, nausea, vomiting.  So far not tried anything over-the-counter for symptoms.  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    Past Medical History:  Diagnosis Date   Asthma     There are no active problems to display for this patient.   Past Surgical History:  Procedure Laterality Date   HERNIA REPAIR         Home Medications    Prior to Admission medications   Medication Sig Start Date End Date Taking? Authorizing Provider  hydrocortisone  1 % ointment Apply 1 Application topically 2 (two) times daily as needed for itching. 05/30/24  Yes Stuart Vernell Norris, PA-C  predniSONE  (DELTASONE ) 20 MG tablet Take 2 tablets (40 mg total) by mouth daily with breakfast. 05/30/24  Yes Stuart Vernell Norris, PA-C    Family History Family History  Problem Relation Age of Onset   Healthy Mother    Healthy Father     Social History Social History   Tobacco Use   Smoking status: Every Day    Types: Cigarettes   Smokeless tobacco: Never  Vaping Use   Vaping status: Never Used  Substance Use Topics   Alcohol use: Not Currently   Drug use: Yes    Types: Marijuana     Allergies   Patient has no known allergies.   Review of Systems Review of Systems Per HPI  Physical Exam Triage Vital Signs ED Triage Vitals  Encounter Vitals Group     BP 05/30/24 1124 (!) 149/76     Girls Systolic BP Percentile --      Girls Diastolic BP Percentile --      Boys Systolic BP Percentile --       Boys Diastolic BP Percentile --      Pulse Rate 05/30/24 1124 66     Resp 05/30/24 1124 16     Temp 05/30/24 1124 97.9 F (36.6 C)     Temp Source 05/30/24 1124 Oral     SpO2 05/30/24 1124 98 %     Weight --      Height --      Head Circumference --      Peak Flow --      Pain Score 05/30/24 1122 0     Pain Loc --      Pain Education --      Exclude from Growth Chart --    No data found.  Updated Vital Signs BP (!) 149/76 (BP Location: Right Arm)   Pulse 66   Temp 97.9 F (36.6 C) (Oral)   Resp 16   SpO2 98%   Visual Acuity Right Eye Distance:   Left Eye Distance:   Bilateral Distance:    Right Eye Near:   Left Eye Near:    Bilateral Near:     Physical Exam Vitals and nursing note reviewed.  Constitutional:      Appearance: Normal appearance.  HENT:     Head: Atraumatic.  Eyes:     Extraocular Movements: Extraocular movements intact.     Conjunctiva/sclera: Conjunctivae normal.  Cardiovascular:     Rate and Rhythm: Normal rate.  Pulmonary:     Effort: Pulmonary effort is normal.  Musculoskeletal:        General: Normal range of motion.     Cervical back: Normal range of motion and neck supple.  Skin:    General: Skin is warm and dry.     Findings: Rash present.     Comments: Flesh-colored papular pinpoint rash widespread across face and neck, erythematous in some areas  Neurological:     General: No focal deficit present.     Mental Status: He is oriented to person, place, and time.  Psychiatric:        Mood and Affect: Mood normal.        Thought Content: Thought content normal.        Judgment: Judgment normal.      UC Treatments / Results  Labs (all labs ordered are listed, but only abnormal results are displayed) Labs Reviewed - No data to display  EKG   Radiology No results found.  Procedures Procedures (including critical care time)  Medications Ordered in UC Medications - No data to display  Initial Impression / Assessment  and Plan / UC Course  I have reviewed the triage vital signs and the nursing notes.  Pertinent labs & imaging results that were available during my care of the patient were reviewed by me and considered in my medical decision making (see chart for details).     Facial dermatitis, unclear etiology but possibly from new work environment.  Treat with hydrocortisone  ointment, Aquaphor, prednisone , supportive home care.  Return precautions reviewed.  Final Clinical Impressions(s) / UC Diagnoses   Final diagnoses:  Facial dermatitis   Discharge Instructions   None    ED Prescriptions     Medication Sig Dispense Auth. Provider   hydrocortisone  1 % ointment Apply 1 Application topically 2 (two) times daily as needed for itching. 60 g Stuart Vernell Norris, PA-C   predniSONE  (DELTASONE ) 20 MG tablet Take 2 tablets (40 mg total) by mouth daily with breakfast. 10 tablet Stuart Vernell Norris, [place]      PDMP not reviewed this encounter.   Stuart Vernell Norris, [place] 05/30/24 1218
